# Patient Record
Sex: Male | Born: 1975 | Race: White | Hispanic: No | Marital: Married | Smoking: Never smoker
Health system: Southern US, Community
[De-identification: ages and names within clinical notes are randomized; demographics above are authoritative.]

## PROBLEM LIST (undated history)

## (undated) DIAGNOSIS — I10 Essential (primary) hypertension: Secondary | ICD-10-CM

## (undated) DIAGNOSIS — G919 Hydrocephalus, unspecified: Secondary | ICD-10-CM

## (undated) DIAGNOSIS — F1120 Opioid dependence, uncomplicated: Secondary | ICD-10-CM

## (undated) DIAGNOSIS — M109 Gout, unspecified: Secondary | ICD-10-CM

---

## 2004-06-21 ENCOUNTER — Emergency Department: Payer: Self-pay | Admitting: Emergency Medicine

## 2004-06-23 ENCOUNTER — Ambulatory Visit: Payer: Self-pay

## 2004-07-07 ENCOUNTER — Ambulatory Visit: Payer: Self-pay | Admitting: Unknown Physician Specialty

## 2004-09-30 ENCOUNTER — Emergency Department: Payer: Self-pay | Admitting: Emergency Medicine

## 2005-06-21 ENCOUNTER — Emergency Department: Payer: Self-pay | Admitting: Unknown Physician Specialty

## 2007-08-02 ENCOUNTER — Emergency Department: Payer: Self-pay | Admitting: Emergency Medicine

## 2007-08-02 ENCOUNTER — Other Ambulatory Visit: Payer: Self-pay

## 2009-08-17 ENCOUNTER — Emergency Department: Payer: Self-pay | Admitting: Emergency Medicine

## 2009-09-22 ENCOUNTER — Emergency Department: Payer: Self-pay | Admitting: Emergency Medicine

## 2009-11-30 ENCOUNTER — Emergency Department: Payer: Self-pay | Admitting: Unknown Physician Specialty

## 2010-01-06 ENCOUNTER — Emergency Department: Payer: Self-pay | Admitting: Internal Medicine

## 2010-01-11 ENCOUNTER — Emergency Department: Payer: Self-pay | Admitting: Emergency Medicine

## 2011-06-17 ENCOUNTER — Emergency Department: Payer: Self-pay | Admitting: Unknown Physician Specialty

## 2012-03-03 ENCOUNTER — Emergency Department: Payer: Self-pay | Admitting: Emergency Medicine

## 2012-03-03 LAB — COMPREHENSIVE METABOLIC PANEL
BUN: 16 mg/dL (ref 7–18)
Bilirubin,Total: 1 mg/dL (ref 0.2–1.0)
Calcium, Total: 9.2 mg/dL (ref 8.5–10.1)
Chloride: 105 mmol/L (ref 98–107)
Co2: 29 mmol/L (ref 21–32)
Creatinine: 1.11 mg/dL (ref 0.60–1.30)
EGFR (African American): >60
EGFR (Non-African Amer.): >60
SGOT(AST): 32 U/L (ref 15–37)
SGPT (ALT): 62 U/L (ref 12–78)
Total Protein: 8.3 g/dL — ABNORMAL HIGH (ref 6.4–8.2)

## 2012-03-03 LAB — CBC WITH DIFFERENTIAL/PLATELET
Basophil #: 0.1 10*3/uL (ref 0.0–0.1)
HCT: 41.2 % (ref 40.0–52.0)
Lymphocyte %: 19.1 %
MCHC: 34.9 g/dL (ref 32.0–36.0)
Monocyte %: 13.4 %
Neutrophil #: 4.6 10*3/uL (ref 1.4–6.5)
Neutrophil %: 63.6 %
Platelet: 162 10*3/uL (ref 150–440)
RDW: 13.1 % (ref 11.5–14.5)
WBC: 7.2 10*3/uL (ref 3.8–10.6)

## 2012-03-03 LAB — MONONUCLEOSIS SCREEN: Mono Test: NEGATIVE

## 2012-03-03 LAB — RAPID INFLUENZA A&B ANTIGENS

## 2012-03-05 LAB — BETA STREP CULTURE(ARMC)

## 2012-07-30 ENCOUNTER — Ambulatory Visit: Payer: Self-pay | Admitting: Nurse Practitioner

## 2012-11-10 ENCOUNTER — Emergency Department: Payer: Self-pay | Admitting: Emergency Medicine

## 2012-11-12 LAB — BETA STREP CULTURE(ARMC)

## 2012-11-18 ENCOUNTER — Emergency Department: Payer: Self-pay | Admitting: Emergency Medicine

## 2012-11-25 ENCOUNTER — Emergency Department: Payer: Self-pay | Admitting: Emergency Medicine

## 2013-05-27 ENCOUNTER — Emergency Department: Payer: Self-pay | Admitting: Emergency Medicine

## 2013-05-27 LAB — COMPREHENSIVE METABOLIC PANEL
Albumin: 3.9 g/dL (ref 3.4–5.0)
Alkaline Phosphatase: 49 U/L
Anion Gap: 5 — ABNORMAL LOW (ref 7–16)
BUN: 18 mg/dL (ref 7–18)
Bilirubin,Total: 0.6 mg/dL (ref 0.2–1.0)
Calcium, Total: 8.7 mg/dL (ref 8.5–10.1)
Chloride: 103 mmol/L (ref 98–107)
Co2: 28 mmol/L (ref 21–32)
Creatinine: 1.11 mg/dL (ref 0.60–1.30)
EGFR (African American): >60
EGFR (Non-African Amer.): >60
Glucose: 105 mg/dL — ABNORMAL HIGH (ref 65–99)
Osmolality: 274 (ref 275–301)
POTASSIUM: 3.8 mmol/L (ref 3.5–5.1)
SGOT(AST): 11 U/L — ABNORMAL LOW (ref 15–37)
SGPT (ALT): 34 U/L (ref 12–78)
Sodium: 136 mmol/L (ref 136–145)
TOTAL PROTEIN: 7.7 g/dL (ref 6.4–8.2)

## 2013-05-27 LAB — CK TOTAL AND CKMB (NOT AT ARMC)
CK, Total: 80 U/L (ref 35–232)
CK-MB: 1.2 ng/mL (ref 0.5–3.6)

## 2013-05-27 LAB — RAPID INFLUENZA A&B ANTIGENS

## 2013-05-28 LAB — CBC WITH DIFFERENTIAL/PLATELET
BASOS PCT: 0.7 %
Basophil #: 0.1 10*3/uL (ref 0.0–0.1)
EOS PCT: 1 %
Eosinophil #: 0.1 10*3/uL (ref 0.0–0.7)
HCT: 43.6 % (ref 40.0–52.0)
HGB: 15.3 g/dL (ref 13.0–18.0)
LYMPHS ABS: 1.9 10*3/uL (ref 1.0–3.6)
Lymphocyte %: 17.7 %
MCH: 29.7 pg (ref 26.0–34.0)
MCHC: 35.2 g/dL (ref 32.0–36.0)
MCV: 84 fL (ref 80–100)
MONOS PCT: 8.7 %
Monocyte #: 0.9 x10 3/mm (ref 0.2–1.0)
Neutrophil #: 7.8 10*3/uL — ABNORMAL HIGH (ref 1.4–6.5)
Neutrophil %: 71.9 %
Platelet: 213 10*3/uL (ref 150–440)
RBC: 5.17 10*6/uL (ref 4.40–5.90)
RDW: 12.8 % (ref 11.5–14.5)
WBC: 10.8 10*3/uL — AB (ref 3.8–10.6)

## 2013-05-30 ENCOUNTER — Emergency Department: Payer: Self-pay | Admitting: Emergency Medicine

## 2016-06-10 ENCOUNTER — Emergency Department
Admission: EM | Admit: 2016-06-10 | Discharge: 2016-06-10 | Disposition: A | Discharge status: Home / Self Care | Payer: Self-pay | Attending: Emergency Medicine | Admitting: Emergency Medicine

## 2016-06-10 ENCOUNTER — Encounter: Payer: Self-pay | Admitting: Emergency Medicine

## 2016-06-10 ENCOUNTER — Emergency Department: Payer: Self-pay

## 2016-06-10 DIAGNOSIS — R519 Headache, unspecified: Secondary | ICD-10-CM

## 2016-06-10 DIAGNOSIS — I10 Essential (primary) hypertension: Secondary | ICD-10-CM | POA: Insufficient documentation

## 2016-06-10 DIAGNOSIS — R51 Headache: Secondary | ICD-10-CM | POA: Insufficient documentation

## 2016-06-10 HISTORY — DX: Gout, unspecified: M10.9

## 2016-06-10 HISTORY — DX: Essential (primary) hypertension: I10

## 2016-06-10 HISTORY — DX: Hydrocephalus, unspecified: G91.9

## 2016-06-10 MED ORDER — SODIUM CHLORIDE 0.9 % IV SOLN
Freq: Once | INTRAVENOUS | Status: AC
  Administered 2016-06-10: 17:00:00 via INTRAVENOUS

## 2016-06-10 MED ORDER — KETOROLAC TROMETHAMINE 30 MG/ML IJ SOLN
30.0000 mg | Freq: Once | INTRAMUSCULAR | Status: AC
  Administered 2016-06-10: 30 mg via INTRAVENOUS
  Filled 2016-06-10: qty 1

## 2016-06-10 MED ORDER — BUTALBITAL-APAP-CAFFEINE 50-325-40 MG PO TABS: 1.0000 | ORAL_TABLET | Freq: Four times a day (QID) | ORAL | 0 refills | Status: AC | PRN

## 2016-06-10 MED ORDER — DIPHENHYDRAMINE HCL 50 MG/ML IJ SOLN
25.0000 mg | Freq: Once | INTRAMUSCULAR | Status: AC
  Administered 2016-06-10: 25 mg via INTRAVENOUS
  Filled 2016-06-10: qty 1

## 2016-06-10 MED ORDER — METOCLOPRAMIDE HCL 5 MG/ML IJ SOLN
10.0000 mg | Freq: Once | INTRAMUSCULAR | Status: AC
  Administered 2016-06-10: 10 mg via INTRAVENOUS
  Filled 2016-06-10: qty 2

## 2016-06-10 NOTE — ED Triage Notes (Signed)
C/O headache x 4 days.  Describes pain and aching pain.  Describes headache as a "tension headache".  Had similar headache about 6-8 years ago and was treated through ED with "a shot".

## 2016-06-10 NOTE — ED Provider Notes (Signed)
[facility] Emergency Department Provider Note        Time seen: ----------------------------------------- 4:17 PM on 06/10/2016 -----------------------------------------    I have reviewed the triage vital signs and the nursing notes.   HISTORY  Chief Complaint Headache    HPI Matthew Forbes is a 41 y.o. male who presents to the ER for headache for the last 4 days. Patient describes pain as aching. He states he has had a tension headache and a similar headache 6-8 years ago and was treated through the ER with a shot. Patient cannot rule out medication he was given.Patient states the headache was in the occipital region, describes it as an ache. Nothing made it better, getting up walking around seems to make it worse. 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   Past Medical History:  Diagnosis Date  . Gout   . Hydrocephalus    As a child  . Hypertension     There are no active problems to display for this patient.   History reviewed. No pertinent surgical history.  Allergies Patient has no known allergies.  Social History Social History  Substance Use Topics  . Smoking status: Never Smoker  . Smokeless tobacco: Never Used  . Alcohol use No    Review of Systems Constitutional: Negative for fever. Cardiovascular: Negative for chest pain. Respiratory: Negative for shortness of breath. Gastrointestinal: Negative for abdominal pain, vomiting and diarrhea. Genitourinary: Negative for dysuria. Musculoskeletal: Negative for back pain. Skin: Negative for rash. Neurological: Positive for headache  10-point ROS otherwise negative.  ____________________________________________   PHYSICAL EXAM:  VITAL SIGNS: ED Triage Vitals  Enc Vitals Group     BP 06/10/16 1511 (!) 161/121     Pulse Rate 06/10/16 1511 (!) 115     Resp 06/10/16 1511 16     Temp 06/10/16 1511 98.2 F  (36.8 C)     Temp Source 06/10/16 1511 Oral     SpO2 06/10/16 1511 98 %     Weight 06/10/16 1512 225 lb (102.1 kg)     Height 06/10/16 1512 5\' 9"  (1.753 m)     Head Circumference --      Peak Flow --      Pain Score 06/10/16 1512 8     Pain Loc --      Pain Edu? --      Excl. in GC? --     Constitutional: Alert and oriented. Well appearing and in no distress. Eyes: Conjunctivae are normal. PERRL. Normal extraocular movements. ENT   Head: Normocephalic and atraumatic.   Nose: No congestion/rhinnorhea.   Mouth/Throat: Mucous membranes are moist.   Neck: No stridor. Cardiovascular: Normal rate, regular rhythm. No murmurs, rubs, or gallops. Respiratory: Normal respiratory effort without tachypnea nor retractions. Breath sounds are clear and equal bilaterally. No wheezes/rales/rhonchi. Gastrointestinal: Soft and nontender. Normal bowel sounds Musculoskeletal: Nontender with normal range of motion in all extremities. No lower extremity tenderness nor edema. Neurologic:  Normal speech and language. No gross focal neurologic deficits are appreciated.  Skin:  Skin is warm, dry and intact. No rash noted. Psychiatric: Mood and affect are normal. Speech and behavior are normal.  ____________________________________________  ED COURSE:  Pertinent labs & imaging results that were available during my care of the patient were reviewed by me and considered in my medical decision making (see chart for details).   Patient presents the ER in  no distress with unusual headache. We will assess with CT imaging, give IV headache cocktail. 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  No acute intracranial abnormalities. ____________________________________________  FINAL ASSESSMENT AND PLAN  Headache  Plan: Patient with labs and imaging as dictated above. Patient is in no acute  distress, headache is currently improved with normal CT imaging. He is stable for outpatient follow-up with his doctor.   Matthew Forbes, Matthew Aguayo E, MD   Note: This note was generated in part or whole with voice recognition software. Voice recognition is usually quite accurate but there are transcription errors that can and very often do occur. I apologize for any typographical errors that were not detected and corrected.     Matthew FilbertJonathan Forbes Duvid Smalls, MD 06/10/16 [phone]

## 2016-06-11 ENCOUNTER — Emergency Department
Admission: EM | Admit: 2016-06-11 | Discharge: 2016-06-11 | Disposition: A | Discharge status: Home / Self Care | Payer: Self-pay | Attending: Emergency Medicine | Admitting: Emergency Medicine

## 2016-06-11 DIAGNOSIS — Z79899 Other long term (current) drug therapy: Secondary | ICD-10-CM | POA: Insufficient documentation

## 2016-06-11 DIAGNOSIS — I10 Essential (primary) hypertension: Secondary | ICD-10-CM | POA: Insufficient documentation

## 2016-06-11 DIAGNOSIS — R51 Headache: Secondary | ICD-10-CM | POA: Insufficient documentation

## 2016-06-11 DIAGNOSIS — R519 Headache, unspecified: Secondary | ICD-10-CM

## 2016-06-11 DIAGNOSIS — M62838 Other muscle spasm: Secondary | ICD-10-CM | POA: Insufficient documentation

## 2016-06-11 LAB — CBC WITH DIFFERENTIAL/PLATELET
BASOS ABS: 0 10*3/uL (ref 0–0.1)
Basophils Relative: 1 %
Eosinophils Absolute: 0.2 10*3/uL (ref 0–0.7)
Eosinophils Relative: 2 %
HEMATOCRIT: 43.2 % (ref 40.0–52.0)
HEMOGLOBIN: 15.4 g/dL (ref 13.0–18.0)
LYMPHS PCT: 20 %
Lymphs Abs: 1.9 10*3/uL (ref 1.0–3.6)
MCH: 29.9 pg (ref 26.0–34.0)
MCHC: 35.6 g/dL (ref 32.0–36.0)
MCV: 84.2 fL (ref 80.0–100.0)
MONO ABS: 0.8 10*3/uL (ref 0.2–1.0)
Monocytes Relative: 9 %
NEUTROS ABS: 6.7 10*3/uL — AB (ref 1.4–6.5)
Neutrophils Relative %: 70 %
Platelets: 217 10*3/uL (ref 150–440)
RBC: 5.13 MIL/uL (ref 4.40–5.90)
RDW: 13.2 % (ref 11.5–14.5)
WBC: 9.6 10*3/uL (ref 3.8–10.6)

## 2016-06-11 LAB — BASIC METABOLIC PANEL
ANION GAP: 6 (ref 5–15)
BUN: 18 mg/dL (ref 6–20)
CO2: 25 mmol/L (ref 22–32)
Calcium: 9.3 mg/dL (ref 8.9–10.3)
Chloride: 108 mmol/L (ref 101–111)
Creatinine, Ser: 1.25 mg/dL — ABNORMAL HIGH (ref 0.61–1.24)
GFR calc Af Amer: >60 mL/min (ref >60)
GFR calc non Af Amer: >60 mL/min (ref >60)
GLUCOSE: 103 mg/dL — AB (ref 65–99)
POTASSIUM: 3.7 mmol/L (ref 3.5–5.1)
Sodium: 139 mmol/L (ref 135–145)

## 2016-06-11 MED ORDER — PROCHLORPERAZINE EDISYLATE 5 MG/ML IJ SOLN
5.0000 mg | Freq: Once | INTRAMUSCULAR | Status: AC
  Administered 2016-06-11: 5 mg via INTRAVENOUS
  Filled 2016-06-11: qty 2

## 2016-06-11 MED ORDER — DIPHENHYDRAMINE HCL 50 MG/ML IJ SOLN
25.0000 mg | Freq: Once | INTRAMUSCULAR | Status: AC
  Administered 2016-06-11: 25 mg via INTRAVENOUS
  Filled 2016-06-11: qty 1

## 2016-06-11 MED ORDER — DEXTROSE-NACL 5-0.45 % IV SOLN
INTRAVENOUS | Status: DC
  Administered 2016-06-11: 1000 mL via INTRAVENOUS

## 2016-06-11 MED ORDER — KETOROLAC TROMETHAMINE 30 MG/ML IJ SOLN
30.0000 mg | Freq: Once | INTRAMUSCULAR | Status: AC
  Administered 2016-06-11: 30 mg via INTRAVENOUS
  Filled 2016-06-11: qty 1

## 2016-06-11 MED ORDER — LORAZEPAM 2 MG/ML IJ SOLN
1.0000 mg | Freq: Once | INTRAMUSCULAR | Status: AC
  Administered 2016-06-11: 1 mg via INTRAVENOUS
  Filled 2016-06-11: qty 1

## 2016-06-11 MED ORDER — PROCHLORPERAZINE MALEATE 10 MG PO TABS: 10.0000 mg | ORAL_TABLET | Freq: Four times a day (QID) | ORAL | 0 refills | Status: AC | PRN

## 2016-06-11 MED ORDER — MORPHINE SULFATE (PF) 4 MG/ML IV SOLN
4.0000 mg | Freq: Once | INTRAVENOUS | Status: AC
  Administered 2016-06-11: 4 mg via INTRAVENOUS

## 2016-06-11 MED ORDER — HYDROCODONE-ACETAMINOPHEN 5-325 MG PO TABS: 1.0000 | ORAL_TABLET | ORAL | 0 refills | Status: DC | PRN

## 2016-06-11 MED ORDER — MORPHINE SULFATE (PF) 4 MG/ML IV SOLN
INTRAVENOUS | Status: AC
  Filled 2016-06-11: qty 1

## 2016-06-11 NOTE — ED Provider Notes (Signed)
[facility] Emergency Department Provider Note   ____________________________________________   First MD Initiated Contact with Patient 06/11/16 [id number]     (approximate)  I have reviewed the triage vital signs and the nursing notes.   HISTORY  Chief Complaint Headache and Neck Pain    HPI Matthew Forbes is a 41 y.o. male who presents to the ED from home with a chief complaint of recurrent headache. 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 Reports frequent headaches which migrate and location. States this is the fifth night of this constant headache. He was seen in the ED yesterday for same; received headache cocktail of Toradol, Reglan and Benadryl with improvement of his headache down to 4/10. Patient reports he felt better for several hours, then headache intensified. Complains mainly of pain in his posteriorhead and vertex associated with neck pain and spasms. Symptoms associated with photophobia. Denies associated fever, chills, chest pain, shortness of breath, abdominal pain, nausea, vomiting, diarrhea. Denies recent travel or trauma. Nothing makes the headache worse. Reports feeling dizzy (vertigo) while ambulating to the treatment room with mild tingling sensation on his left side. No associated slurred speech, confusion, facial droop or extremity weakness.   Past Medical History:  Diagnosis Date  . Gout   . Hydrocephalus    As a child  . Hypertension     There are no active problems to display for this patient.   History reviewed. No pertinent surgical history.  Prior to Admission medications   Medication Sig Start Date End Date Taking? Authorizing Provider  butalbital-acetaminophen-caffeine (FIORICET, ESGIC) [phone] MG tablet Take 1-2 tablets by mouth every 6 (six) hours as needed for headache. 06/10/16 06/10/17  Emily FilbertJonathan E Williams, MD    Allergies Patient has no known  allergies.  No family history on file.  Social History Social History  Substance Use Topics  . Smoking status: Never Smoker  . Smokeless tobacco: Never Used  . Alcohol use No    Review of Systems  Constitutional: No fever/chills. Eyes: No visual changes. ENT: No sore throat. Cardiovascular: Denies chest pain. Respiratory: Denies shortness of breath. Gastrointestinal: No abdominal pain.  No nausea, no vomiting.  No diarrhea.  No constipation. Genitourinary: Negative for dysuria. Musculoskeletal: Negative for back pain. Skin: Negative for rash. Neurological: Positive for headaches. Negative for focal weakness or numbness.  10-point ROS otherwise negative.  ____________________________________________   PHYSICAL EXAM:  VITAL SIGNS: ED Triage Vitals  Enc Vitals Group     BP 06/11/16 0326 (!) 161/110     Pulse Rate 06/11/16 0326 (!) 101     Resp 06/11/16 0530 19     Temp 06/11/16 0326 97.9 F (36.6 C)     Temp Source 06/11/16 0326 Oral     SpO2 06/11/16 0326 100 %     Weight 06/11/16 0326 225 lb (102.1 kg)     Height 06/11/16 0326 5\' 9"  (1.753 m)     Head Circumference --      Peak Flow --      Pain Score 06/11/16 0331 9     Pain Loc --      Pain Edu? --      Excl. in GC? --     Constitutional: Alert and oriented. Well appearing and in mild acute distress. Eyes: Conjunctivae are normal. PERRL. EOMI. Head: Atraumatic. Nose: No congestion/rhinnorhea. Mouth/Throat: Mucous membranes are moist.  Oropharynx non-erythematous. Neck: No stridor.  Supple neck  without meningismus.  Bilateral paraspinal neck spasms.  No carotid bruits. Cardiovascular: Normal rate, regular rhythm. Grossly normal heart sounds.  Good peripheral circulation. Respiratory: Normal respiratory effort.  No retractions. Lungs CTAB. Gastrointestinal: Soft and nontender. No distention. No abdominal bruits. No CVA tenderness. Musculoskeletal: No lower extremity tenderness nor edema.  No joint  effusions. Neurologic:  Normal speech and language. No gross focal neurologic deficits are appreciated. No gait instability. Skin:  Skin is warm, dry and intact. No rash noted. No petechiae. Psychiatric: Mood and affect are normal. Speech and behavior are normal.  ____________________________________________   LABS (all labs ordered are listed, but only abnormal results are displayed)  Labs Reviewed  CBC WITH DIFFERENTIAL/PLATELET - Abnormal; Notable for the following:       Result Value   Neutro Abs 6.7 (*)    All other components within normal limits  BASIC METABOLIC PANEL - Abnormal; Notable for the following:    Glucose, Bld 103 (*)    Creatinine, Ser 1.25 (*)    All other components within normal limits   ____________________________________________  EKG  ED ECG REPORT I, SUNG,JADE J, the attending physician, personally viewed and interpreted this ECG.   Date: 06/11/2016  EKG Time: 0521  Rate: 92  Rhythm: normal EKG, normal sinus rhythm  Axis: Normal  Intervals:none  ST&T Change: T-wave inversion inferior leads. Compared to 07/2007, T wave inversion present in lead 3 ____________________________________________  RADIOLOGY  None ____________________________________________   PROCEDURES  Procedure(s) performed: None  Procedures  Critical Care performed: No  ____________________________________________   INITIAL IMPRESSION / ASSESSMENT AND PLAN / ED COURSE  Pertinent labs & imaging results that were available during my care of the patient were reviewed by me and considered in my medical decision making (see chart for details).  41 year old male with a history of headaches returns to the ED for acute posterior headache with neck spasms. Neck is supple without meningsmus. No focal neuorological deficits on exam. Will obtain screening labwork, administer IV compazine, benadryl, toradal and ativan for muscle relaxation. Reports history of hypertension but does  not currently take antihypertensives.  Clinical Course as of Jun 11 732  Sat Jun 11, 2016  4098 Updated patient of laboratory results. Resting more comfortably after IV medications. IV fluids infusing. Care transferred to Dr. Lenard Lance pending reassessment. If patient's pain continues to be improved and he is without focal neurological deficits, then I anticipate he would be able to be discharged home on Compazine.  [JS]    Clinical Course User Index [JS] Irean Hong, MD     ____________________________________________   FINAL CLINICAL IMPRESSION(S) / ED DIAGNOSES  Final diagnoses:  Acute nonintractable headache, unspecified headache type      NEW MEDICATIONS STARTED DURING THIS VISIT:  New Prescriptions   No medications on file     Note:  This document was prepared using Dragon voice recognition software and may include unintentional dictation errors.    Irean Hong, MD 06/11/16 [phone]

## 2016-06-11 NOTE — ED Notes (Signed)
Patient c/o dizziness and bodywide but more strongly left sided tingling as he is walking to ED01 and says he has blurred vision with imbalance perception.  He continues to complain of his headache pain.  BP is 171/117 during evaluation.

## 2016-06-11 NOTE — ED Triage Notes (Signed)
Pt presents to ED with c/o HA and neck pain. Pt reports being seen here yesterday for same, reports taking prescribed medications as directed without relief. Pt reports medications given in ED yesterday helped by bringing pain down to 4/10. Pt reports being unable to sleep d/t pain.

## 2016-06-11 NOTE — ED Provider Notes (Addendum)
-----------------------------------------   10:07 AM on 06/11/2016 -----------------------------------------  Patient states he feels much better. Complete resolution of headache. We'll discharge with a very short course of pain medication and PCP follow-up. Patient agreeable to plan. I provided my normal headache return precautions.   Minna AntisKevin Jakhi Dishman, MD 06/11/16 1007    Minna AntisKevin Jakayden Cancio, MD 06/11/16 [phone]

## 2016-06-11 NOTE — Discharge Instructions (Addendum)
1. You may take Compazine as needed for excess nausea. 2. Return to the ER for worsening symptoms, focal weakness or numbness, fever, persistent vomiting, difficulty breathing or other concerns.

## 2016-06-12 ENCOUNTER — Ambulatory Visit (HOSPITAL_COMMUNITY)
Admission: AD | Admit: 2016-06-12 | Discharge: 2016-06-12 | Disposition: A | Discharge status: Home / Self Care | Payer: Self-pay | Source: Other Acute Inpatient Hospital | Attending: Student in an Organized Health Care Education/Training Program | Admitting: Student in an Organized Health Care Education/Training Program

## 2016-06-12 ENCOUNTER — Encounter: Payer: Self-pay | Admitting: *Deleted

## 2016-06-12 ENCOUNTER — Emergency Department
Admission: EM | Admit: 2016-06-12 | Discharge: 2016-06-12 | Disposition: A | Discharge status: Other Acute Inpatient Hospital | Payer: Self-pay | Attending: Student in an Organized Health Care Education/Training Program | Admitting: Student in an Organized Health Care Education/Training Program

## 2016-06-12 ENCOUNTER — Emergency Department: Payer: Self-pay

## 2016-06-12 DIAGNOSIS — Z982 Presence of cerebrospinal fluid drainage device: Secondary | ICD-10-CM | POA: Insufficient documentation

## 2016-06-12 DIAGNOSIS — R519 Headache, unspecified: Secondary | ICD-10-CM

## 2016-06-12 DIAGNOSIS — R51 Headache: Secondary | ICD-10-CM | POA: Insufficient documentation

## 2016-06-12 DIAGNOSIS — Z79899 Other long term (current) drug therapy: Secondary | ICD-10-CM | POA: Insufficient documentation

## 2016-06-12 DIAGNOSIS — I10 Essential (primary) hypertension: Secondary | ICD-10-CM | POA: Insufficient documentation

## 2016-06-12 LAB — CBC WITH DIFFERENTIAL/PLATELET
BASOS PCT: 0 %
Basophils Absolute: 0 10*3/uL (ref 0–0.1)
EOS ABS: 0.1 10*3/uL (ref 0–0.7)
EOS PCT: 2 %
HCT: 38.7 % — ABNORMAL LOW (ref 40.0–52.0)
Hemoglobin: 14.1 g/dL (ref 13.0–18.0)
Lymphocytes Relative: 12 %
Lymphs Abs: 1 10*3/uL (ref 1.0–3.6)
MCH: 30.2 pg (ref 26.0–34.0)
MCHC: 36.5 g/dL — AB (ref 32.0–36.0)
MCV: 82.7 fL (ref 80.0–100.0)
MONOS PCT: 4 %
Monocytes Absolute: 0.4 10*3/uL (ref 0.2–1.0)
NEUTROS PCT: 82 %
Neutro Abs: 7 10*3/uL — ABNORMAL HIGH (ref 1.4–6.5)
PLATELETS: 188 10*3/uL (ref 150–440)
RBC: 4.68 MIL/uL (ref 4.40–5.90)
RDW: 12.8 % (ref 11.5–14.5)
WBC: 8.5 10*3/uL (ref 3.8–10.6)

## 2016-06-12 LAB — COMPREHENSIVE METABOLIC PANEL
ALBUMIN: 4.3 g/dL (ref 3.5–5.0)
ALK PHOS: 41 U/L (ref 38–126)
ALT: 24 U/L (ref 17–63)
ANION GAP: 8 (ref 5–15)
AST: 24 U/L (ref 15–41)
BUN: 14 mg/dL (ref 6–20)
CHLORIDE: 107 mmol/L (ref 101–111)
CO2: 23 mmol/L (ref 22–32)
Calcium: 8.8 mg/dL — ABNORMAL LOW (ref 8.9–10.3)
Creatinine, Ser: 1.01 mg/dL (ref 0.61–1.24)
GFR calc non Af Amer: >60 mL/min (ref >60)
GLUCOSE: 114 mg/dL — AB (ref 65–99)
POTASSIUM: 3.6 mmol/L (ref 3.5–5.1)
SODIUM: 138 mmol/L (ref 135–145)
Total Bilirubin: 1 mg/dL (ref 0.3–1.2)
Total Protein: 7.3 g/dL (ref 6.5–8.1)

## 2016-06-12 MED ORDER — PROCHLORPERAZINE EDISYLATE 5 MG/ML IJ SOLN
10.0000 mg | Freq: Once | INTRAMUSCULAR | Status: AC
  Administered 2016-06-12: 10 mg via INTRAVENOUS
  Filled 2016-06-12: qty 2

## 2016-06-12 MED ORDER — MAGNESIUM SULFATE 2 GM/50ML IV SOLN
2.0000 g | Freq: Once | INTRAVENOUS | Status: AC
  Administered 2016-06-12: 2 g via INTRAVENOUS
  Filled 2016-06-12: qty 50

## 2016-06-12 MED ORDER — DEXAMETHASONE SODIUM PHOSPHATE 10 MG/ML IJ SOLN
10.0000 mg | Freq: Once | INTRAMUSCULAR | Status: AC
  Administered 2016-06-12: 10 mg via INTRAVENOUS
  Filled 2016-06-12: qty 1

## 2016-06-12 MED ORDER — DIPHENHYDRAMINE HCL 50 MG/ML IJ SOLN
25.0000 mg | Freq: Once | INTRAMUSCULAR | Status: AC
  Administered 2016-06-12: 25 mg via INTRAVENOUS
  Filled 2016-06-12: qty 1

## 2016-06-12 NOTE — ED Provider Notes (Signed)
[facility] Emergency Department Provider Note    First MD Initiated Contact with Patient 06/12/16 [phone]     (approximate)  I have reviewed the triage vital signs and the nursing notes.   HISTORY  Chief Complaint Headache    HPI Matthew Forbes is a 41 y.o. 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 Patient had CT imaging on first event that showed no evidence of hydrocephalus. He's remained neuro intact. States he has some blurry vision. States that he'll be treated symptomatically and have brief improvement and then after discharge will have recurrence of headache. States is similar to previous has never had one this severe. States is been gradually progressing. Denies any fevers. Has some neck soreness but is able to fully range his neck. No rash. No nausea or vomiting. No recent trauma.   Past Medical History:  Diagnosis Date  . Gout   . Hydrocephalus    As a child  . Hypertension    History reviewed. No pertinent family history. History reviewed. No pertinent surgical history. There are no active problems to display for this patient.     Prior to Admission medications   Medication Sig Start Date End Date Taking? Authorizing Provider  butalbital-acetaminophen-caffeine (FIORICET, ESGIC) [phone] MG tablet Take 1-2 tablets by mouth every 6 (six) hours as needed for headache. 06/10/16 06/10/17  Emily Filbert, MD  HYDROcodone-acetaminophen (NORCO/VICODIN) 5-325 MG tablet Take 1 tablet by mouth every 4 (four) hours as needed. 06/11/16   Minna Antis, MD  prochlorperazine (COMPAZINE) 10 MG tablet Take 1 tablet (10 mg total) by mouth every 6 (six) hours as needed for nausea. 06/11/16   Irean Hong, MD    Allergies Patient has no known allergies.    Social  History Social History  Substance Use Topics  . Smoking status: Never Smoker  . Smokeless tobacco: Never Used  . Alcohol use No    Review of Systems Patient denies headaches, rhinorrhea, blurry vision, numbness, shortness of breath, chest pain, edema, cough, abdominal pain, nausea, vomiting, diarrhea, dysuria, fevers, rashes or hallucinations unless otherwise stated above in HPI. ____________________________________________   PHYSICAL EXAM:  VITAL SIGNS: Vitals:   06/12/16 0451  BP: (!) 181/102  Pulse: 98  Resp: 18  Temp: 98.1 F (36.7 C)    Constitutional: Alert and oriented. Well appearing and in no acute distress. Eyes: Conjunctivae are normal. PERRL. EOMI. Head: Atraumatic. Nose: No congestion/rhinnorhea. Mouth/Throat: Mucous membranes are moist.  Oropharynx non-erythematous. Neck: No stridor. Painless ROM. No cervical spine tenderness to palpation Hematological/Lymphatic/Immunilogical: No cervical lymphadenopathy. Cardiovascular: Normal rate, regular rhythm. Grossly normal heart sounds.  Good peripheral circulation. Respiratory: Normal respiratory effort.  No retractions. Lungs CTAB. Gastrointestinal: Soft and nontender. No distention. No abdominal bruits. No CVA tenderness. Musculoskeletal: No lower extremity tenderness nor edema.  No joint effusions. Neurologic:  CN- intact.  No facial droop, Normal FNF.  Normal heel to shin.  Sensation intact bilaterally. Normal speech and language. No gross focal neurologic deficits are appreciated. No gait instability. Skin:  Skin is warm, dry and intact. No rash noted. Psychiatric: Mood and affect are normal. Speech and behavior are normal.  ____________________________________________   LABS (all labs ordered are listed, but only abnormal results are displayed)  No results found for this or any previous visit (from the past  24  hour(s)). ____________________________________________  EKG____________________________________________  RADIOLOGY  I personally reviewed all radiographic images ordered to evaluate for the above acute complaints and reviewed radiology reports and findings.  These findings were personally discussed with the patient.  Please see medical record for radiology report.    ____________________________________________   PROCEDURES  Procedure(s) performed:  Procedures    Critical Care performed: no ____________________________________________   INITIAL IMPRESSION / ASSESSMENT AND PLAN / ED COURSE  Pertinent labs & imaging results that were available during my care of the patient were reviewed by me and considered in my medical decision making (see chart for details).  DDX: shunt failure, hydro, meningitis, tension, cluster  Matthew Forbes is a 41 y.o. who presents to the ED with persistent severe headache as described above. Patient afebrile with mild hypertension. This is now his third visit for similar headache. Has not been able to follow-up with primary care. States that outpatient medications are not improving. States that the symptoms and vision disturbances worsening. Denies any fevers. His neck is supple with full painless range of motion therefore in the setting of no fever and no tachycardia with 5 days of headache I do not feel this is consistent with meningitis. Shunt series ordered to evaluate for shunt fracture shows no acute abnormality. At this point I do feel that the patient would benefit ROM secondary evaluation and probable [facility].  Spoke with the ER physician [facility] who kindly agrees to evaluate patient.  Have discussed with the patient and available family all diagnostics and treatments performed thus far and all questions were answered to the best of my ability. The patient  demonstrates understanding and agreement with plan.       ____________________________________________   FINAL CLINICAL IMPRESSION(S) / ED DIAGNOSES  Final diagnoses:  Headache  Headache      NEW MEDICATIONS STARTED DURING THIS VISIT:  New Prescriptions   No medications on file     Note:  This document was prepared using Dragon voice recognition software and may include unintentional dictation errors.    Willy EddyPatrick Chloe Bluett, MD 06/12/16 [phone]

## 2016-06-12 NOTE — ED Notes (Signed)
Pt here for headache; third day in a row pt has been evaluated here for same; pt says he wants to be referred to a specialist or admitted; pt ambulatory with steady gait; in no acute distress

## 2016-06-12 NOTE — ED Triage Notes (Signed)
Pt is neurologically intact.

## 2016-06-12 NOTE — ED Notes (Signed)
Report given to [facility] 

## 2016-06-12 NOTE — ED Triage Notes (Signed)
Pt presents w/ c/o persistent headache that is not controlled by rx'd meds. Pt states hx of migraine and has had a shunt placed in head. Pt c/o headache has persisted for past 5 days despite multiple visits to ED for pain control. Pt states he is unable to f/u outpt due to lack of insurance and inability to pay. Pt denies vomiting, c/o nausea intermittently.

## 2016-06-15 ENCOUNTER — Emergency Department
Admission: EM | Admit: 2016-06-15 | Discharge: 2016-06-15 | Disposition: A | Discharge status: Home / Self Care | Payer: Self-pay | Attending: Emergency Medicine | Admitting: Emergency Medicine

## 2016-06-15 ENCOUNTER — Encounter: Payer: Self-pay | Admitting: Emergency Medicine

## 2016-06-15 DIAGNOSIS — J09X2 Influenza due to identified novel influenza A virus with other respiratory manifestations: Secondary | ICD-10-CM | POA: Insufficient documentation

## 2016-06-15 DIAGNOSIS — J101 Influenza due to other identified influenza virus with other respiratory manifestations: Secondary | ICD-10-CM

## 2016-06-15 DIAGNOSIS — I10 Essential (primary) hypertension: Secondary | ICD-10-CM | POA: Insufficient documentation

## 2016-06-15 LAB — POCT RAPID STREP A: STREPTOCOCCUS, GROUP A SCREEN (DIRECT): NEGATIVE

## 2016-06-15 LAB — INFLUENZA PANEL BY PCR (TYPE A & B)
INFLAPCR: POSITIVE — AB
Influenza B By PCR: NEGATIVE

## 2016-06-15 MED ORDER — OSELTAMIVIR PHOSPHATE 75 MG PO CAPS
75.0000 mg | ORAL_CAPSULE | Freq: Once | ORAL | Status: AC
  Administered 2016-06-15: 75 mg via ORAL
  Filled 2016-06-15: qty 1

## 2016-06-15 MED ORDER — OSELTAMIVIR PHOSPHATE 75 MG PO CAPS: 75.0000 mg | ORAL_CAPSULE | Freq: Two times a day (BID) | ORAL | 0 refills | Status: AC

## 2016-06-15 MED ORDER — ACETAMINOPHEN 500 MG PO TABS
1000.0000 mg | ORAL_TABLET | Freq: Once | ORAL | Status: AC
  Administered 2016-06-15: 1000 mg via ORAL
  Filled 2016-06-15: qty 2

## 2016-06-15 NOTE — ED Notes (Signed)
Pt given ginger ale.

## 2016-06-15 NOTE — ED Notes (Signed)
Upon assessment pt reports feeling weak, having a sore throat, and fever.

## 2016-06-15 NOTE — ED Provider Notes (Signed)
[facility] Emergency Department Provider Note   ____________________________________________   First MD Initiated Contact with Patient 06/15/16 [phone]     (approximate)  I have reviewed the triage vital signs and the nursing notes.   HISTORY  Chief Complaint Fever    HPI Matthew Forbes is a 41 y.o. male who comes into the hospital today with a concern that he has flu. The patient reports he started having a cough about 2-3 days ago. He reports that the cough worsened yesterday. Within a few hours he developed fever to 102. He has body aches in his chest hurts when he coughs. The patient reports that he thinks he got the fluid here because he was here with a headache a few days ago and there are many people in the waiting room with the flu. The patient rates his pain a 6 out of 10 in intensity currently. He reports that he just feels beat up and run over. His throat is raw from coughing. He reports he has some mild shortness of breath but nothing severe. He denies any nausea or vomiting. The patient's here today for evaluation.   Past Medical History:  Diagnosis Date  . Gout   . Hydrocephalus    As a child  . Hypertension     There are no active problems to display for this patient.   History reviewed. No pertinent surgical history.  Prior to Admission medications   Medication Sig Start Date End Date Taking? Authorizing Provider  butalbital-acetaminophen-caffeine (FIORICET, ESGIC) [phone] MG tablet Take 1-2 tablets by mouth every 6 (six) hours as needed for headache. 06/10/16 06/10/17  Emily FilbertJonathan E Williams, MD  HYDROcodone-acetaminophen (NORCO/VICODIN) 5-325 MG tablet Take 1 tablet by mouth every 4 (four) hours as needed. 06/11/16   Minna AntisKevin Paduchowski, MD  oseltamivir (TAMIFLU) 75 MG capsule Take 1 capsule (75 mg total) by mouth 2 (two) times daily. 06/15/16 06/20/16  Rebecka ApleyAllison P Karlina Suares, MD  prochlorperazine (COMPAZINE) 10 MG tablet Take 1 tablet (10 mg  total) by mouth every 6 (six) hours as needed for nausea. 06/11/16   Irean HongJade J Sung, MD    Allergies Patient has no known allergies.  No family history on file.  Social History Social History  Substance Use Topics  . Smoking status: Never Smoker  . Smokeless tobacco: Never Used  . Alcohol use No    Review of Systems Constitutional: No fever/chills Eyes: No visual changes. ENT:  sore throat. Cardiovascular:  chest pain Cough Respiratory: mild shortness of breath. Gastrointestinal: No abdominal pain.  No nausea, no vomiting.  No diarrhea.  No constipation. Genitourinary: Negative for dysuria. Musculoskeletal: body aches Skin: Negative for rash. Neurological: Negative for headaches, focal weakness or numbness.  10-point ROS otherwise negative.  ____________________________________________   PHYSICAL EXAM:  VITAL SIGNS: ED Triage Vitals  Enc Vitals Group     BP 06/15/16 0240 (!) 156/105     Pulse Rate 06/15/16 0240 (!) 119     Resp 06/15/16 0240 20     Temp 06/15/16 0240 99.5 F (37.5 C)     Temp src --      SpO2 06/15/16 0240 97 %     Weight 06/15/16 0241 225 lb (102.1 kg)     Height 06/15/16 0241 5\' 9"  (1.753 m)     Head Circumference --      Peak Flow --      Pain Score 06/15/16 0241 5     Pain Loc --  Pain Edu? --      Excl. in GC? --     Constitutional: Alert and oriented. Well appearing and in Mild distress. Eyes: Conjunctivae are normal. PERRL. EOMI. Head: Atraumatic. Nose: No congestion/rhinnorhea. Mouth/Throat: Mucous membranes are moist.  Oropharynx non-erythematous. Cardiovascular: Normal rate, regular rhythm. Grossly normal heart sounds.  Good peripheral circulation. Respiratory: Normal respiratory effort.  No retractions. Lungs CTAB. Gastrointestinal: Soft and nontender. No distention. Positive bowel sounds Musculoskeletal: No lower extremity tenderness nor edema.   Neurologic:  Normal speech and language.  Skin:  Skin is warm, dry and intact.   Psychiatric: Mood and affect are normal.   ____________________________________________   LABS (all labs ordered are listed, but only abnormal results are displayed)  Labs Reviewed  INFLUENZA PANEL BY PCR (TYPE A & B) - Abnormal; Notable for the following:       Result Value   Influenza A By PCR POSITIVE (*)    All other components within normal limits  CULTURE, GROUP A STREP [facility])  POCT RAPID STREP A   ____________________________________________  EKG  none ____________________________________________  RADIOLOGY  none ____________________________________________   PROCEDURES  Procedure(s) performed: None  Procedures  Critical Care performed: No  ____________________________________________   INITIAL IMPRESSION / ASSESSMENT AND PLAN / ED COURSE  Pertinent labs & imaging results that were available during my care of the patient were reviewed by me and considered in my medical decision making (see chart for details).  This is a 41 year old male who comes into the hospital today with flulike symptoms. The patient did have a flu test as well as a strep and his fluids positive. He is mildly tachycardic I will give him a dose of Tylenol 1000 mg, Tamiflu 75 mg and some oral hydration. The patient is not febrile at this time. I will reassess the patient and ensure that his heart rate is improved prior to his discharge.     Patient's heart rate is improved. He'll be discharged home. ____________________________________________   FINAL CLINICAL IMPRESSION(S) / ED DIAGNOSES  Final diagnoses:  Influenza A      NEW MEDICATIONS STARTED DURING THIS VISIT:  New Prescriptions   OSELTAMIVIR (TAMIFLU) 75 MG CAPSULE    Take 1 capsule (75 mg total) by mouth 2 (two) times daily.     Note:  This document was prepared using Dragon voice recognition software and may include unintentional dictation errors.    Rebecka Apley, MD 06/15/16 [phone]

## 2016-06-15 NOTE — ED Triage Notes (Signed)
Pt presents to ED with c/o flu-like symptoms, dry cough x 2 days, sore throat, chills, fever and generalized body aches since last night.  tmax fever 102 and pt took ibuprofen with relief. Denies n/v/d.

## 2016-06-17 LAB — CULTURE, GROUP A STREP (THRC)

## 2018-01-31 ENCOUNTER — Other Ambulatory Visit: Payer: Self-pay

## 2018-01-31 ENCOUNTER — Encounter: Payer: Self-pay | Admitting: *Deleted

## 2018-01-31 ENCOUNTER — Emergency Department: Payer: Self-pay

## 2018-01-31 ENCOUNTER — Emergency Department
Admission: EM | Admit: 2018-01-31 | Discharge: 2018-01-31 | Disposition: A | Discharge status: Home / Self Care | Payer: Self-pay | Attending: Emergency Medicine | Admitting: Emergency Medicine

## 2018-01-31 DIAGNOSIS — I10 Essential (primary) hypertension: Secondary | ICD-10-CM | POA: Insufficient documentation

## 2018-01-31 DIAGNOSIS — R202 Paresthesia of skin: Secondary | ICD-10-CM | POA: Insufficient documentation

## 2018-01-31 DIAGNOSIS — M25512 Pain in left shoulder: Secondary | ICD-10-CM | POA: Insufficient documentation

## 2018-01-31 DIAGNOSIS — M5410 Radiculopathy, site unspecified: Secondary | ICD-10-CM | POA: Insufficient documentation

## 2018-01-31 LAB — CBC
HCT: 47 % (ref 40.0–52.0)
HEMOGLOBIN: 16.6 g/dL (ref 13.0–18.0)
MCH: 29.9 pg (ref 26.0–34.0)
MCHC: 35.4 g/dL (ref 32.0–36.0)
MCV: 84.4 fL (ref 80.0–100.0)
PLATELETS: 246 10*3/uL (ref 150–440)
RBC: 5.57 MIL/uL (ref 4.40–5.90)
RDW: 13.1 % (ref 11.5–14.5)
WBC: 9.1 10*3/uL (ref 3.8–10.6)

## 2018-01-31 LAB — BASIC METABOLIC PANEL
ANION GAP: 10 (ref 5–15)
BUN: 17 mg/dL (ref 6–20)
CALCIUM: 9.8 mg/dL (ref 8.9–10.3)
CO2: 26 mmol/L (ref 22–32)
CREATININE: 1.04 mg/dL (ref 0.61–1.24)
Chloride: 105 mmol/L (ref 98–111)
Glucose, Bld: 123 mg/dL — ABNORMAL HIGH (ref 70–99)
Potassium: 4.9 mmol/L (ref 3.5–5.1)
SODIUM: 141 mmol/L (ref 135–145)

## 2018-01-31 LAB — TROPONIN I

## 2018-01-31 MED ORDER — HYDROCODONE-ACETAMINOPHEN 5-325 MG PO TABS
1.0000 | ORAL_TABLET | Freq: Once | ORAL | Status: AC
  Administered 2018-01-31: 1 via ORAL
  Filled 2018-01-31: qty 1

## 2018-01-31 MED ORDER — PREDNISONE 20 MG PO TABS: 40.0000 mg | ORAL_TABLET | Freq: Every day | ORAL | 0 refills | Status: DC

## 2018-01-31 MED ORDER — PREDNISONE 20 MG PO TABS
60.0000 mg | ORAL_TABLET | Freq: Once | ORAL | Status: AC
Start: 2018-01-31 — End: 2018-01-31
  Administered 2018-01-31: 60 mg via ORAL
  Filled 2018-01-31: qty 3

## 2018-01-31 MED ORDER — HYDROCODONE-ACETAMINOPHEN 5-325 MG PO TABS: 1.0000 | ORAL_TABLET | ORAL | 0 refills | Status: DC | PRN

## 2018-01-31 NOTE — ED Triage Notes (Addendum)
Pt to ED reporting intermittent left shoulder and left neck pains with numbness in left arm. No dizziness or lightheadedness reported but pt reports a headache when the pain worsens. No cardiac hx but hx of HTN reported. No injury reported

## 2018-01-31 NOTE — ED Provider Notes (Signed)
[facility] Emergency Department Provider Note  Time seen: 2:17 PM  I have reviewed the triage vital signs and the nursing notes.   HISTORY  Chief Complaint Neck Pain and Shoulder Pain    HPI Matthew Forbes is a 42 y.o. male with no significant past medical history besides hypertension who presents to the emergency department for left neck and left shoulder pain.  According to the patient for the past few months he has been expensing pain in the left neck especially with movement of the neck.  He states over the past few weeks the pain is gone into the shoulder and occasionally will have tingling sensations in his left shoulder with certain neck movements.  Denies any chest pain.  Denies any trouble breathing.  No nausea or diaphoresis.  Describes the pain is moderate dull pain worse with movement of the neck.   Past Medical History:  Diagnosis Date  . Gout   . Hydrocephalus    As a child  . Hypertension     There are no active problems to display for this patient.   History reviewed. No pertinent surgical history.  Prior to Admission medications   Medication Sig Start Date End Date Taking? Authorizing Provider  HYDROcodone-acetaminophen (NORCO/VICODIN) 5-325 MG tablet Take 1 tablet by mouth every 4 (four) hours as needed. 06/11/16   Minna Antis, MD  prochlorperazine (COMPAZINE) 10 MG tablet Take 1 tablet (10 mg total) by mouth every 6 (six) hours as needed for nausea. 06/11/16   Irean Hong, MD    No Known Allergies  History reviewed. No pertinent family history.  Social History Social History   Tobacco Use  . Smoking status: Never Smoker  . Smokeless tobacco: Never Used  Substance Use Topics  . Alcohol use: No  . Drug use: No    Review of Systems Constitutional: Negative for fever. Cardiovascular: Negative for chest pain. Respiratory: Negative for shortness of breath. Gastrointestinal: Negative for abdominal pain Genitourinary:  Negative for urinary compaints Musculoskeletal: Left neck/left shoulder pain. Skin: Negative for skin complaints  Neurological: Negative for headache.  Occasional tingling in the left shoulder with certain movements of the neck. All other ROS negative  ____________________________________________   PHYSICAL EXAM:  VITAL SIGNS: ED Triage Vitals  Enc Vitals Group     BP 01/31/18 1242 (!) 172/118     Pulse Rate 01/31/18 1242 (!) 123     Resp 01/31/18 1242 16     Temp 01/31/18 1242 98.8 F (37.1 C)     Temp Source 01/31/18 1242 Oral     SpO2 01/31/18 1242 95 %     Weight 01/31/18 1243 245 lb (111.1 kg)     Height 01/31/18 1243 5\' 9"  (1.753 m)     Head Circumference --      Peak Flow --      Pain Score 01/31/18 1242 8     Pain Loc --      Pain Edu? --      Excl. in GC? --     Constitutional: Alert and oriented. Well appearing and in no distress. Eyes: Normal exam ENT   Head: Normocephalic and atraumatic.   Mouth/Throat: Mucous membranes are moist. Cardiovascular: Normal rate, regular rhythm. No murmur Respiratory: Normal respiratory effort without tachypnea nor retractions. Breath sounds are clear  Gastrointestinal: Soft and nontender. No distention.   Musculoskeletal: Mild left paraspinal tenderness to palpation around the cervical spine with tenderness down the left trapezius.  Neurovascular intact  distally. Neurologic:  Normal speech and language. No gross focal neurologic deficits.  Equal grip strength.  No pronator drift. Skin:  Skin is warm, dry and intact.  Psychiatric: Mood and affect are normal. Speech and behavior are normal.   ____________________________________________    EKG  EKG reviewed and interpreted by myself shows sinus tachycardia 110 bpm with a narrow QRS, normal axis, normal intervals, no concerning ST changes.  ____________________________________________    RADIOLOGY  Chest x-ray  negative  ____________________________________________   INITIAL IMPRESSION / ASSESSMENT AND PLAN / ED COURSE  Pertinent labs & imaging results that were available during my care of the patient were reviewed by me and considered in my medical decision making (see chart for details).  Patient presents to the emergency department for left neck/left shoulder pain worse with movement.  Tenderness/pain is reproducible with palpation.  Pain is worse with movement.  Highly suspect muscular skeletal pain versus pinched nerve or radicular pain.  However differential would also include ACS.  Patient's labs including cardiac enzymes are negative.  Chest x-ray is normal.  We will treat the patient with a course of prednisone and pain medication.  I did discuss with the patient have his symptoms do not improve over the next 7 days he is to follow-up with orthopedics for further evaluation and consideration of possible localized corticosteroid injections.  ____________________________________________   FINAL CLINICAL IMPRESSION(S) / ED DIAGNOSES  Radicular pain Neck pain    Minna Antis, MD 01/31/18 1420

## 2020-06-05 ENCOUNTER — Encounter: Payer: Self-pay | Admitting: Emergency Medicine

## 2020-06-05 ENCOUNTER — Emergency Department: Payer: HRSA Program

## 2020-06-05 ENCOUNTER — Other Ambulatory Visit: Payer: Self-pay

## 2020-06-05 ENCOUNTER — Emergency Department
Admission: EM | Admit: 2020-06-05 | Discharge: 2020-06-05 | Disposition: A | Discharge status: Home / Self Care | Payer: HRSA Program | Attending: Emergency Medicine | Admitting: Emergency Medicine

## 2020-06-05 DIAGNOSIS — I10 Essential (primary) hypertension: Secondary | ICD-10-CM | POA: Diagnosis not present

## 2020-06-05 DIAGNOSIS — U071 COVID-19: Secondary | ICD-10-CM | POA: Diagnosis not present

## 2020-06-05 DIAGNOSIS — R059 Cough, unspecified: Secondary | ICD-10-CM | POA: Diagnosis present

## 2020-06-05 DIAGNOSIS — J1282 Pneumonia due to coronavirus disease 2019: Secondary | ICD-10-CM | POA: Diagnosis not present

## 2020-06-05 LAB — COMPREHENSIVE METABOLIC PANEL
ALT: 42 U/L (ref 0–44)
AST: 33 U/L (ref 15–41)
Albumin: 3.9 g/dL (ref 3.5–5.0)
Alkaline Phosphatase: 48 U/L (ref 38–126)
Anion gap: 15 (ref 5–15)
BUN: 19 mg/dL (ref 6–20)
CO2: 23 mmol/L (ref 22–32)
Calcium: 8.9 mg/dL (ref 8.9–10.3)
Chloride: 102 mmol/L (ref 98–111)
Creatinine, Ser: 1.03 mg/dL (ref 0.61–1.24)
GFR, Estimated: >60 mL/min (ref >60)
Glucose, Bld: 127 mg/dL — ABNORMAL HIGH (ref 70–99)
Potassium: 3.6 mmol/L (ref 3.5–5.1)
Sodium: 140 mmol/L (ref 135–145)
Total Bilirubin: 0.9 mg/dL (ref 0.3–1.2)
Total Protein: 7.8 g/dL (ref 6.5–8.1)

## 2020-06-05 LAB — CBC WITH DIFFERENTIAL/PLATELET
Abs Immature Granulocytes: 0.03 10*3/uL (ref 0.00–0.07)
Basophils Absolute: 0 10*3/uL (ref 0.0–0.1)
Basophils Relative: 0 %
Eosinophils Absolute: 0 10*3/uL (ref 0.0–0.5)
Eosinophils Relative: 0 %
HCT: 45.7 % (ref 39.0–52.0)
Hemoglobin: 15.9 g/dL (ref 13.0–17.0)
Immature Granulocytes: 1 %
Lymphocytes Relative: 30 %
Lymphs Abs: 1.4 10*3/uL (ref 0.7–4.0)
MCH: 29.4 pg (ref 26.0–34.0)
MCHC: 34.8 g/dL (ref 30.0–36.0)
MCV: 84.6 fL (ref 80.0–100.0)
Monocytes Absolute: 0.8 10*3/uL (ref 0.1–1.0)
Monocytes Relative: 17 %
Neutro Abs: 2.5 10*3/uL (ref 1.7–7.7)
Neutrophils Relative %: 52 %
Platelets: 132 10*3/uL — ABNORMAL LOW (ref 150–400)
RBC: 5.4 MIL/uL (ref 4.22–5.81)
RDW: 12.9 % (ref 11.5–15.5)
Smear Review: NORMAL
WBC: 4.8 10*3/uL (ref 4.0–10.5)
nRBC: 0 % (ref 0.0–0.2)

## 2020-06-05 LAB — TROPONIN I (HIGH SENSITIVITY): Troponin I (High Sensitivity): 5 ng/L (ref <18)

## 2020-06-05 LAB — RESP PANEL BY RT-PCR (FLU A&B, COVID) ARPGX2
Influenza A by PCR: NEGATIVE
Influenza B by PCR: NEGATIVE
SARS Coronavirus 2 by RT PCR: POSITIVE — AB

## 2020-06-05 LAB — LACTIC ACID, PLASMA: Lactic Acid, Venous: 1.8 mmol/L (ref 0.5–1.9)

## 2020-06-05 LAB — FIBRIN DERIVATIVES D-DIMER (ARMC ONLY): Fibrin derivatives D-dimer (ARMC): 598.62 ng/mL (FEU) — ABNORMAL HIGH (ref 0.00–499.00)

## 2020-06-05 MED ORDER — AZITHROMYCIN 250 MG PO TABS: ORAL_TABLET | ORAL | 0 refills | Status: DC

## 2020-06-05 MED ORDER — DEXAMETHASONE SODIUM PHOSPHATE 10 MG/ML IJ SOLN
10.0000 mg | Freq: Once | INTRAMUSCULAR | Status: AC
  Administered 2020-06-05: 10 mg via INTRAVENOUS
  Filled 2020-06-05: qty 1

## 2020-06-05 MED ORDER — IOHEXOL 350 MG/ML SOLN
75.0000 mL | Freq: Once | INTRAVENOUS | Status: AC | PRN
  Administered 2020-06-05: 75 mL via INTRAVENOUS
  Filled 2020-06-05: qty 75

## 2020-06-05 MED ORDER — METHYLPREDNISOLONE 4 MG PO TBPK: ORAL_TABLET | ORAL | 0 refills | Status: DC

## 2020-06-05 MED ORDER — ALBUTEROL SULFATE HFA 108 (90 BASE) MCG/ACT IN AERS: 2.0000 | INHALATION_SPRAY | Freq: Four times a day (QID) | RESPIRATORY_TRACT | 2 refills | Status: DC | PRN

## 2020-06-05 NOTE — Discharge Instructions (Signed)
Take over-the-counter vitamin C, vitamin D, and zinc to help boost your immune system Take over-the-counter Mucinex for cough as needed Take over-the-counter Tylenol and ibuprofen for fever and body aches Follow-up with your regular doctor as needed Return emergency department worsening Quarantine for 10 days from onset of symptoms.  At that time you may return to school/work if your symptoms have improved and you have been fever free for 24 hours.

## 2020-06-05 NOTE — ED Provider Notes (Signed)
[facility] Emergency Department Provider Note  ____________________________________________   Event Date/Time   First MD Initiated Contact with Patient 06/05/20 1502     (approximate)  I have reviewed the triage vital signs and the nursing notes.   HISTORY  Chief Complaint Fever and loss of smell    HPI Matthew Forbes is a 45 y.o. male presents to the emergency department with URI symptoms for 10 days.   Is complaining of cough, congestion, fever, chills, chest pain, shortness of breath, patient had a positive home COVID test.   patient is not vaccinated.  States he is coughing up blood at this time.  States he is very short of breath when he tries to walk.  Has a history of hypertension but does not take medication for it   Past Medical History:  Diagnosis Date  . Gout   . Hydrocephalus (HCC)    As a child  . Hypertension     There are no problems to display for this patient.   History reviewed. No pertinent surgical history.  Prior to Admission medications   Medication Sig Start Date End Date Taking? Authorizing Provider  albuterol (VENTOLIN HFA) 108 (90 Base) MCG/ACT inhaler Inhale 2 puffs into the lungs every 6 (six) hours as needed for wheezing or shortness of breath. 06/05/20  Yes Juliene Kirsh, Roselyn Bering, PA-C  azithromycin (ZITHROMAX Z-PAK) 250 MG tablet 2 pills today then 1 pill a day for 4 days 06/05/20  Yes Jami Ohlin, Roselyn Bering, PA-C  methylPREDNISolone (MEDROL DOSEPAK) 4 MG TBPK tablet Take 6 pills on day one then decrease by 1 pill each day 06/05/20  Yes Erinn Mendosa, Roselyn Bering, PA-C  HYDROcodone-acetaminophen (NORCO/VICODIN) 5-325 MG tablet Take 1 tablet by mouth every 4 (four) hours as needed. 01/31/18   Minna Antis, MD  predniSONE (DELTASONE) 20 MG tablet Take 2 tablets (40 mg total) by mouth daily. 01/31/18   Minna Antis, MD  prochlorperazine (COMPAZINE) 10 MG tablet Take 1 tablet (10 mg total) by mouth every 6 (six) hours as needed for  nausea. 06/11/16   Irean Hong, MD    Allergies Patient has no known allergies.  No family history on file.  Social History Social History   Tobacco Use  . Smoking status: Never Smoker  . Smokeless tobacco: Never Used  Substance Use Topics  . Alcohol use: No  . Drug use: No    Review of Systems  Constitutional: Positive fever/chills Eyes: No visual changes. ENT: Denies sore throat. Respiratory: Positive cough Cardiovascular: Complains of mild chest pain Gastrointestinal: Denies vomiting or diarrhea Genitourinary: Negative for dysuria. Musculoskeletal: Negative for back pain. Skin: Negative for rash. Neuro: Denies change in neuro status    ____________________________________________   PHYSICAL EXAM:  VITAL SIGNS: ED Triage Vitals  Enc Vitals Group     BP 06/05/20 1358 (!) 151/92     Pulse Rate 06/05/20 1358 96     Resp 06/05/20 1358 20     Temp 06/05/20 1358 98.1 F (36.7 C)     Temp Source 06/05/20 1358 Oral     SpO2 06/05/20 1401 97 %     Weight 06/05/20 1357 270 lb (122.5 kg)     Height 06/05/20 1357 5\' 9"  (1.753 m)     Head Circumference --      Peak Flow --      Pain Score 06/05/20 1357 5     Pain Loc --      Pain Edu? --  Excl. in GC? --     Constitutional: Alert and oriented. Well appearing and in no acute distress. Eyes: Conjunctivae are normal.  Head: Atraumatic. Nose: No congestion/rhinnorhea. Mouth/Throat: Mucous membranes are moist.  Neck:  supple no lymphadenopathy noted Cardiovascular: Normal rate, regular rhythm. Heart sounds are normal Respiratory: Normal respiratory effort.  No retractions, lungs CTA GU: deferred Musculoskeletal: FROM all extremities, warm and well perfused Neurologic:  Normal speech and language.  Skin:  Skin is warm, dry and intact. No rash noted. Psychiatric: Mood and affect are normal. Speech and behavior are normal.  ____________________________________________   LABS (all labs ordered are listed,  but only abnormal results are displayed)  Labs Reviewed  RESP PANEL BY RT-PCR (FLU A&B, COVID) ARPGX2 - Abnormal; Notable for the following components:      Result Value   SARS Coronavirus 2 by RT PCR POSITIVE (*)    All other components within normal limits  COMPREHENSIVE METABOLIC PANEL - Abnormal; Notable for the following components:   Glucose, Bld 127 (*)    All other components within normal limits  FIBRIN DERIVATIVES D-DIMER ([facility] ONLY) - Abnormal; Notable for the following components:   Fibrin derivatives D-dimer ([facility]) 598.62 (*)    All other components within normal limits  CBC WITH DIFFERENTIAL/PLATELET - Abnormal; Notable for the following components:   Platelets 132 (*)    All other components within normal limits  LACTIC ACID, PLASMA  LACTIC ACID, PLASMA  CBC WITH DIFFERENTIAL/PLATELET  TROPONIN I (HIGH SENSITIVITY)  TROPONIN I (HIGH SENSITIVITY)   ____________________________________________   ____________________________________________  RADIOLOGY  Chest x-ray  ____________________________________________   PROCEDURES  Procedure(s) performed: Ekg shows nsr, see physician read  Procedures    ____________________________________________   INITIAL IMPRESSION / ASSESSMENT AND PLAN / ED COURSE  Pertinent labs & imaging results that were available during my care of the patient were reviewed by me and considered in my medical decision making (see chart for details).   Patient is a 45 year old unvaccinated male who complains of covid symptoms.  Patient is complaining of shortness of breath along with coughing up blood.  Symptoms for more than 10 days.  Positive home test for COVID.  See HPI Physical exam shows patient to become short of breath when talking.  Vitals appear to be normal and stable  DDx: COVID, COVID-pneumonia, myocarditis, PE, MI  Chest x-ray, CBC, metabolic panel, troponin, lactic acid, D-dimer, EKG, and COVID  test  ----------------------------------------- 6:24 PM on 06/05/2020 -----------------------------------------  cbc is normal, metabolic panel is normal, resp panel is +covid, lactic acid is 1.8, troponin is normal, ddimer is elevated   Chest xray is normal Ct chest for PE is neg for PE but does show covid pneumonia  Did discuss the findings with patient.  Patient's oxygen saturation has remained above 95% on room air.  I do feel he is stable to be discharged.  He was given Decadron 10 mg IV.  He will be given a prescription for steroid pack, Zithromax, albuterol inhaler.  He is to return to the emergency department if worsening.  He was discharged in stable condition.  The patient was instructed to quarantine themselves at home.  Follow-up with your regular doctor if any concerns.  Return emergency department for worsening. OTC measures discussed     Matthew Forbes was evaluated in Emergency Department on 06/05/2020 for the symptoms described in the history of present illness. He was evaluated in the context of the global COVID-19 pandemic, which necessitated consideration that  the patient might be at risk for infection with the SARS-CoV-2 virus that causes COVID-19. Institutional protocols and algorithms that pertain to the evaluation of patients at risk for COVID-19 are in a state of rapid change based on information released by regulatory bodies including the CDC and federal and state organizations. These policies and algorithms were followed during the patient's care in the ED.   As part of my medical decision making, I reviewed the following data within the electronic MEDICAL RECORD NUMBER Nursing notes reviewed and incorporated, Labs reviewed , EKG interpreted NSR, Old chart reviewed, Radiograph reviewed , Notes from prior ED visits and [place] Controlled Substance Database  ____________________________________________   FINAL CLINICAL IMPRESSION(S) / ED DIAGNOSES  Final diagnoses:   Pneumonia due to COVID-19 virus      NEW MEDICATIONS STARTED DURING THIS VISIT:  New Prescriptions   ALBUTEROL (VENTOLIN HFA) 108 (90 BASE) MCG/ACT INHALER    Inhale 2 puffs into the lungs every 6 (six) hours as needed for wheezing or shortness of breath.   AZITHROMYCIN (ZITHROMAX Z-PAK) 250 MG TABLET    2 pills today then 1 pill a day for 4 days   METHYLPREDNISOLONE (MEDROL DOSEPAK) 4 MG TBPK TABLET    Take 6 pills on day one then decrease by 1 pill each day     Note:  This document was prepared using Dragon voice recognition software and may include unintentional dictation errors.    Faythe Ghee, PA-C 06/05/20 1827    Gilles Chiquito, MD 06/05/20 2036

## 2020-06-05 NOTE — ED Notes (Signed)
Attempted for 20g IV at L ac.  

## 2020-06-05 NOTE — ED Triage Notes (Signed)
Pt in via EMS from home with c/o covid +. Pt has fever.

## 2020-06-05 NOTE — ED Triage Notes (Signed)
Pt reports covid + for 10 days and still with intermittent fever and loss of smell and bodyaches and cough

## 2020-06-05 NOTE — ED Notes (Signed)
Lavender top sent to lab by this tech.

## 2022-02-23 ENCOUNTER — Emergency Department: Payer: Self-pay

## 2022-02-23 ENCOUNTER — Encounter: Payer: Self-pay | Admitting: Emergency Medicine

## 2022-02-23 ENCOUNTER — Other Ambulatory Visit: Payer: Self-pay

## 2022-02-23 ENCOUNTER — Emergency Department
Admission: EM | Admit: 2022-02-23 | Discharge: 2022-02-23 | Disposition: A | Discharge status: Home / Self Care | Payer: Self-pay | Attending: Emergency Medicine | Admitting: Emergency Medicine

## 2022-02-23 DIAGNOSIS — U071 COVID-19: Secondary | ICD-10-CM | POA: Insufficient documentation

## 2022-02-23 DIAGNOSIS — I10 Essential (primary) hypertension: Secondary | ICD-10-CM | POA: Insufficient documentation

## 2022-02-23 LAB — BASIC METABOLIC PANEL
Anion gap: 5 (ref 5–15)
BUN: 15 mg/dL (ref 6–20)
CO2: 29 mmol/L (ref 22–32)
Calcium: 8.8 mg/dL — ABNORMAL LOW (ref 8.9–10.3)
Chloride: 105 mmol/L (ref 98–111)
Creatinine, Ser: 1.26 mg/dL — ABNORMAL HIGH (ref 0.61–1.24)
GFR, Estimated: >60 mL/min (ref >60)
Glucose, Bld: 135 mg/dL — ABNORMAL HIGH (ref 70–99)
Potassium: 3.9 mmol/L (ref 3.5–5.1)
Sodium: 139 mmol/L (ref 135–145)

## 2022-02-23 LAB — RESP PANEL BY RT-PCR (FLU A&B, COVID) ARPGX2
Influenza A by PCR: NEGATIVE
Influenza B by PCR: NEGATIVE
SARS Coronavirus 2 by RT PCR: POSITIVE — AB

## 2022-02-23 LAB — CBC
HCT: 41.1 % (ref 39.0–52.0)
Hemoglobin: 13.6 g/dL (ref 13.0–17.0)
MCH: 29.2 pg (ref 26.0–34.0)
MCHC: 33.1 g/dL (ref 30.0–36.0)
MCV: 88.4 fL (ref 80.0–100.0)
Platelets: 174 10*3/uL (ref 150–400)
RBC: 4.65 MIL/uL (ref 4.22–5.81)
RDW: 13.2 % (ref 11.5–15.5)
WBC: 5.5 10*3/uL (ref 4.0–10.5)
nRBC: 0 % (ref 0.0–0.2)

## 2022-02-23 LAB — TROPONIN I (HIGH SENSITIVITY): Troponin I (High Sensitivity): 5 ng/L (ref <18)

## 2022-02-23 MED ORDER — ACETAMINOPHEN 500 MG PO TABS
1000.0000 mg | ORAL_TABLET | Freq: Once | ORAL | Status: AC
  Administered 2022-02-23: 1000 mg via ORAL
  Filled 2022-02-23: qty 2

## 2022-02-23 MED ORDER — NIRMATRELVIR/RITONAVIR (PAXLOVID)TABLET: 3.0000 | ORAL_TABLET | Freq: Two times a day (BID) | ORAL | 0 refills | Status: AC

## 2022-02-23 MED ORDER — PREDNISONE 50 MG PO TABS: 50.0000 mg | ORAL_TABLET | Freq: Every day | ORAL | 0 refills | Status: AC

## 2022-02-23 MED ORDER — ALBUTEROL SULFATE HFA 108 (90 BASE) MCG/ACT IN AERS: 2.0000 | INHALATION_SPRAY | Freq: Four times a day (QID) | RESPIRATORY_TRACT | 2 refills | Status: AC | PRN

## 2022-02-23 MED ORDER — IPRATROPIUM-ALBUTEROL 0.5-2.5 (3) MG/3ML IN SOLN
3.0000 mL | Freq: Once | RESPIRATORY_TRACT | Status: AC
  Administered 2022-02-23: 3 mL via RESPIRATORY_TRACT
  Filled 2022-02-23: qty 3

## 2022-02-23 MED ORDER — PREDNISONE 20 MG PO TABS
60.0000 mg | ORAL_TABLET | Freq: Once | ORAL | Status: AC
  Administered 2022-02-23: 60 mg via ORAL
  Filled 2022-02-23: qty 3

## 2022-02-23 MED ORDER — SODIUM CHLORIDE 0.9 % IV BOLUS
1000.0000 mL | Freq: Once | INTRAVENOUS | Status: AC
  Administered 2022-02-23: 1000 mL via INTRAVENOUS

## 2022-02-23 NOTE — ED Provider Notes (Signed)
[facility] Provider Note    Event Date/Time   First MD Initiated Contact with Patient 02/23/22 [phone]     (approximate)   History   Chief Complaint Chest Pain and Shortness of Breath   HPI Matthew Forbes is a 46 y.o. male, hypertension, gout, hydrocephalus, presents emergency department for evaluation of chest pain/shortness of breath x4 days.  Reports fever, night sweats, sinus congestion, and chest tightness that is gradually developed over the past 4 days.  Sick contacts.  He has not taken any medications thus far.  He does state that he has a history of COVID pneumonia within the past year.  Denies abdominal pain, flank pain, nausea/vomiting, diarrhea, dysuria, dizziness/lightheadedness, or rash/lesions.  History Limitations: No limitations.        Physical Exam  Triage Vital Signs: ED Triage Vitals  Enc Vitals Group     BP 02/23/22 0735 (!) 179/111     Pulse Rate 02/23/22 0735 96     Resp 02/23/22 0735 20     Temp 02/23/22 0735 99.2 F (37.3 C)     Temp Source 02/23/22 0735 Oral     SpO2 02/23/22 0735 96 %     Weight 02/23/22 0734 240 lb (108.9 kg)     Height 02/23/22 0734 5\' 6"  (1.676 m)     Head Circumference --      Peak Flow --      Pain Score 02/23/22 0734 6     Pain Loc --      Pain Edu? --      Excl. in GC? --     Most recent vital signs: Vitals:   02/23/22 0735  BP: (!) 179/111  Pulse: 96  Resp: 20  Temp: 99.2 F (37.3 C)  SpO2: 96%    General: Awake, mildly tachypneic, though still speaking in full sentences. Skin: Warm, dry. No rashes or lesions.  Eyes: PERRL. Conjunctivae normal.  CV: Good peripheral perfusion.  Resp: Slightly labored.  Mild wheezing present bilaterally in the bases. Abd: Soft, non-tender. No distention.  Neuro: At baseline. No gross neurological deficits.  Musculoskeletal: Normal ROM of all extremities.  Focused Exam: N/A.  Physical Exam    ED Results / Procedures / Treatments   Labs (all labs ordered are listed, but only abnormal results are displayed) Labs Reviewed  RESP PANEL BY RT-PCR (FLU A&B, COVID) ARPGX2 - Abnormal; Notable for the following components:      Result Value   SARS Coronavirus 2 by RT PCR POSITIVE (*)    All other components within normal limits  BASIC METABOLIC PANEL - Abnormal; Notable for the following components:   Glucose, Bld 135 (*)    Creatinine, Ser 1.26 (*)    Calcium 8.8 (*)    All other components within normal limits  CBC  TROPONIN I (HIGH SENSITIVITY)  TROPONIN I (HIGH SENSITIVITY)     EKG Sinus rhythm, rate of 98, no segment changes, no AV blocks, no axis deviations, no QT prolongation, normal QRS interval.    RADIOLOGY  ED Provider Interpretation: I personally reviewed and interpreted this chest x-ray, no evidence of pneumonia.  DG Chest 2 View  Result Date: 02/23/2022 CLINICAL DATA:  46 year old male with history of chest pain and shortness of breath. EXAM: CHEST - 2 VIEW COMPARISON:  Chest x-ray 06/05/2020. FINDINGS: Small bore tubing projecting over the right hemithorax, similar to the prior examination, presumably ventriculoperitoneal shunt tubing (the lateral tube appears discontinuous, similar to the prior  study). Lung volumes are normal. No consolidative airspace disease. No pleural effusions. No pneumothorax. No pulmonary nodule or mass noted. Pulmonary vasculature and the cardiomediastinal silhouette are within normal limits. IMPRESSION: 1.  No radiographic evidence of acute cardiopulmonary disease. Electronically Signed   By: Trudie Reed M.D.   On: 02/23/2022 07:57    PROCEDURES:  Critical Care performed: N/A.  Procedures    MEDICATIONS ORDERED IN ED: Medications  acetaminophen (TYLENOL) tablet 1,000 mg (1,000 mg Oral Given 02/23/22 0805)  ipratropium-albuterol (DUONEB) 0.5-2.5 (3) MG/3ML nebulizer solution 3 mL (3 mLs Nebulization Given 02/23/22 0806)  predniSONE (DELTASONE) tablet 60 mg (60 mg  Oral Given 02/23/22 0805)  sodium chloride 0.9 % bolus 1,000 mL (1,000 mLs Intravenous New Bag/Given 02/23/22 0833)     IMPRESSION / MDM / ASSESSMENT AND PLAN / ED COURSE  I reviewed the triage vital signs and the nursing notes.                              Differential diagnosis includes, but is not limited to, COVID-19, influenza, bronchitis, community-acquired pneumonia, viral URI, allergic rhinitis.   ED Course Patient appears clinically stable, but uncomfortable and mildly tachypneic.  Wheezing appreciated on exam.  Will treat here with acetaminophen, prednisone, and DuoNeb solution.  No leukocytosis or anemia.  BMP shows no significant electrolyte abnormalities.  However, AKI present with a creatinine of 1.26.  Will initiate IV fluids.  Initial troponin 5, consistent with previous baseline values.  EKG unremarkable.  Unlikely ACS related.  Respiratory panel positive for COVID-19, negative for influenza.  Assessment/Plan Presentation consistent with COVID-19 infection, confirmed by PCR.  Chest x-ray shows no evidence of pneumonia.  Lab work-up has been reassuring.  Overall the patient does appear clinically well, though he does have some mild wheezing bilaterally in the lung bases.  We will provide him with prescriptions for prednisone and albuterol inhaler.  Additionally, given that he has had a history of complications from COVID in the past, will start him on Paxlovid as well.  Recommend they follow-up with a healthcare provider within the next 1 to 2 days for reevaluation.  He was amenable to this plan.  Will discharge.  Considered admission for this patient, but given his stable presentation, he is unlikely to benefit from admission.  Provided the patient with anticipatory guidance, return precautions, and educational material. Encouraged the patient to return to the emergency department at any time if they begin to experience any new or worsening symptoms. Patient expressed  understanding and agreed with the plan.   Patient's presentation is most consistent with acute complicated illness / injury requiring diagnostic workup.       FINAL CLINICAL IMPRESSION(S) / ED DIAGNOSES   Final diagnoses:  COVID-19     Rx / DC Orders   ED Discharge Orders          Ordered    nirmatrelvir/ritonavir EUA (PAXLOVID) 20 x 150 MG & 10 x 100MG  TABS  2 times daily        02/23/22 0917    predniSONE (DELTASONE) 50 MG tablet  Daily with breakfast        02/23/22 0917    albuterol (VENTOLIN HFA) 108 (90 Base) MCG/ACT inhaler  Every 6 hours PRN        02/23/22 0917             Note:  This document was prepared using Dragon voice recognition software  and may include unintentional dictation errors.   Teodoro Spray, [place] 02/23/22 Gardner Candle    Blake Divine, MD 02/23/22 [phone]

## 2022-02-23 NOTE — ED Triage Notes (Signed)
Pt via POV from home. Pt c/o mid-sternal chest tightness, SOB, chills, and cough for the past 4 days. Denies fevers. Denies respiratory/cardiac hx. States he took a COVID test that was negative. Pt is A&Ox4 and NAD

## 2022-02-23 NOTE — ED Notes (Signed)
EDP saw pt, pt complains of head cold, sinus pressure, congestion, and tightness in chest with breathing since 4 days.

## 2022-02-23 NOTE — Discharge Instructions (Addendum)
-  Please take the full course of your Paxlovid and prednisone as prescribed  You may utilize the albuterol inhaler as needed for chest tightness/shortness of breath.  -Please follow-up with a healthcare provider within the next 1 to 2 days for reevaluation.  -Return to the emergency department anytime if you begin to experience any new or worsening symptoms.

## 2022-04-10 IMAGING — DX DG CHEST 1V PORT
1 series · 1 of 1 positions shown · non-contrast
Comparison: January 31, 2018.

CLINICAL DATA: Shortness of breath, PHOVU-24 positive.

EXAM:
PORTABLE CHEST 1 VIEW

[chest ap]
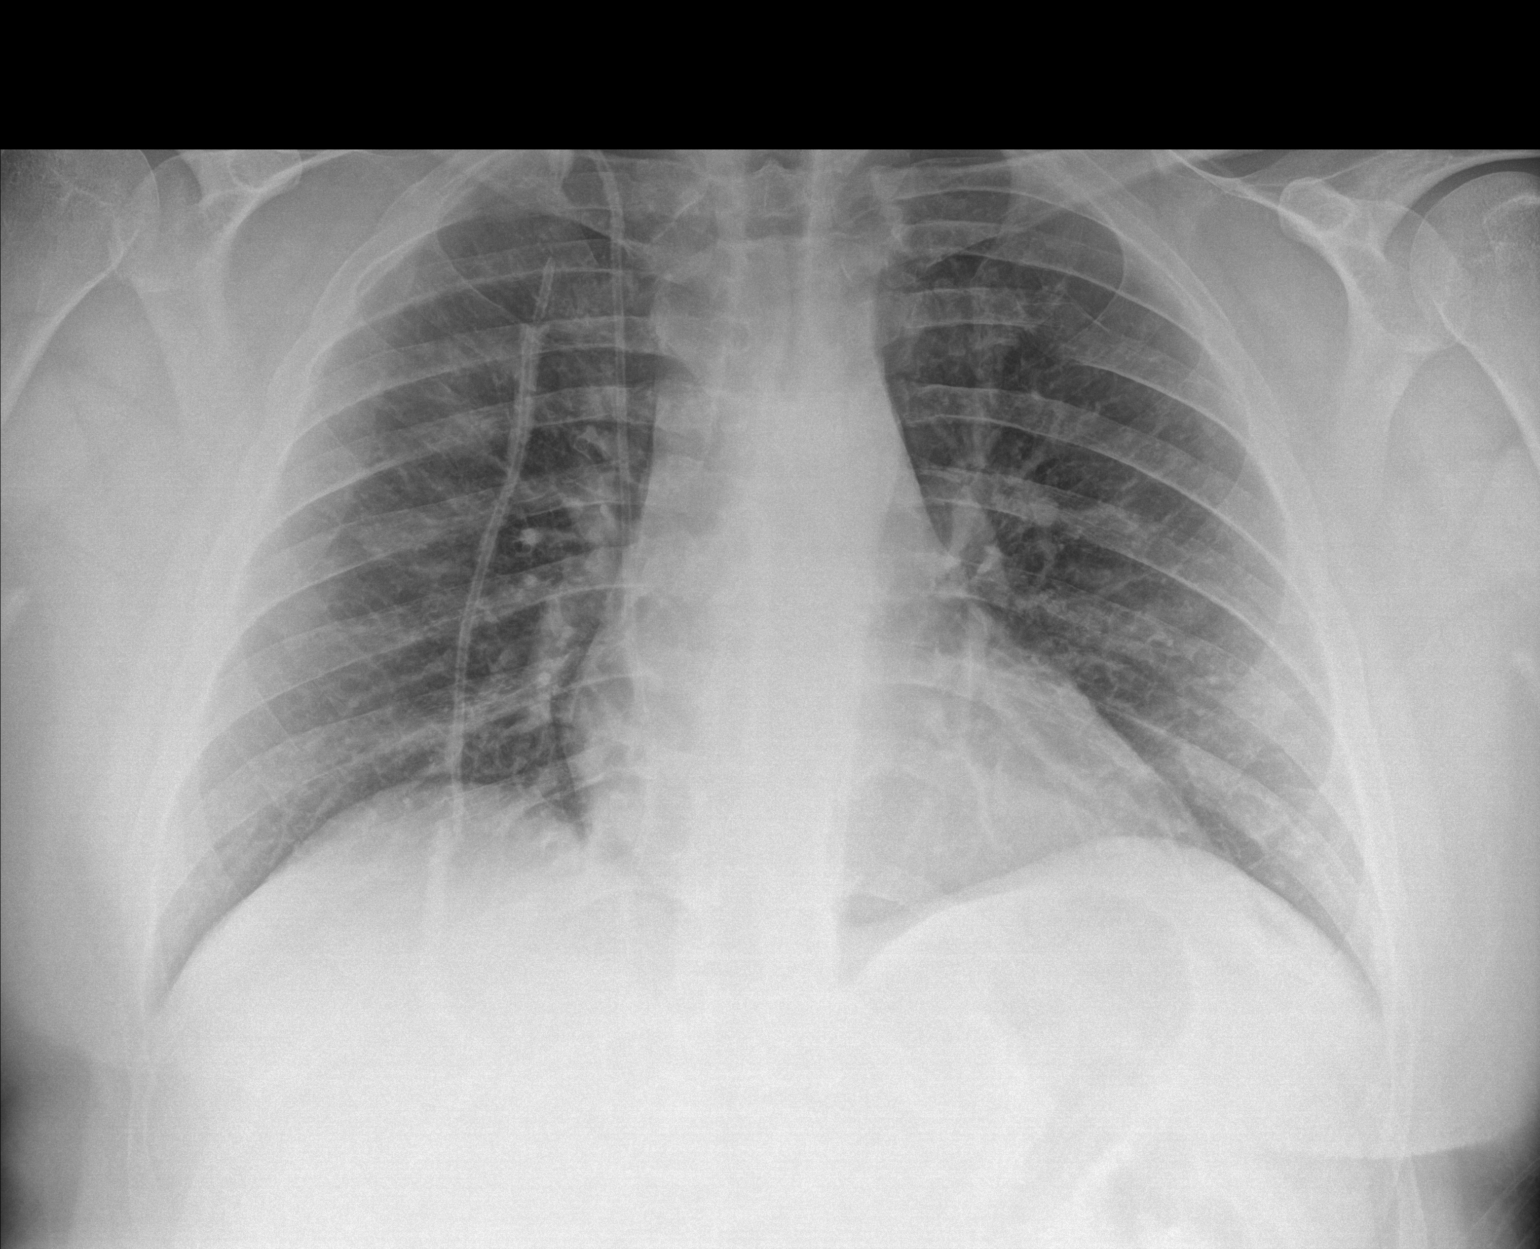

[1 of 1 positions shown; findings below may reference images not displayed]

FINDINGS: The heart size and mediastinal contours are within normal limits.
Both lungs are clear. No pneumothorax or pleural effusion is noted.
The visualized skeletal structures are unremarkable.
IMPRESSION: No active disease.

## 2022-04-10 IMAGING — CT CT ANGIO CHEST
2 of 6 series · 18 of 46 positions shown · IV contrast (APPLIED)
Comparison: Chest x-ray 06/05/2020, CT chest 08/03/2007

CLINICAL DATA: COVID

EXAM:
CT ANGIOGRAPHY CHEST WITH CONTRAST
TECHNIQUE: Multidetector CT imaging of the chest was performed using the
standard protocol during bolus administration of intravenous
contrast. Multiplanar CT image reconstructions and MIPs were
obtained to evaluate the vascular anatomy.
CONTRAST:  75mL OMNIPAQUE IOHEXOL 350 MG/ML SOLN

[Series 7: thins · axial · 0.78mm/px · z∈[-343,-90]mm · 15 of 279 slices shown]
[im 13/279  lung]
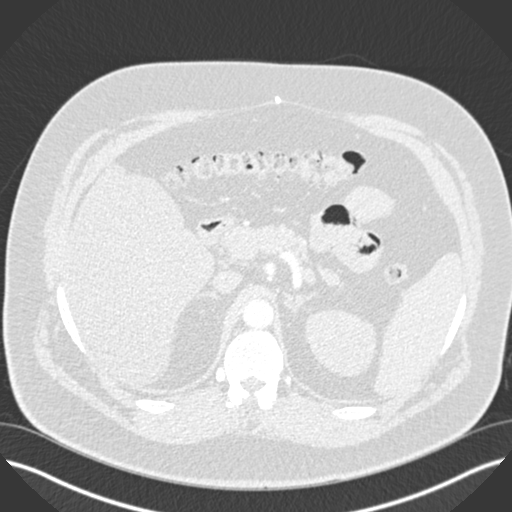
[im 37/279  soft-tissue]
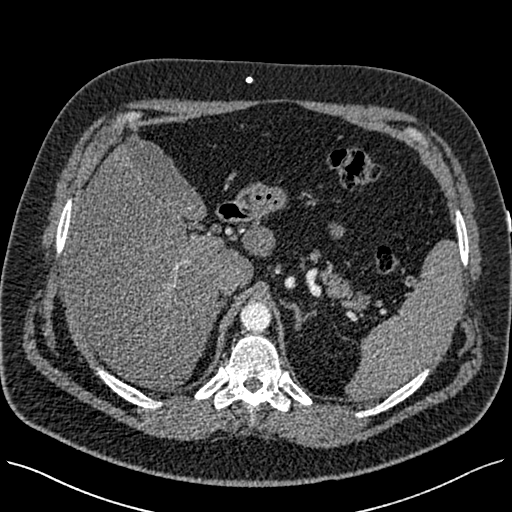
[im 49/279  lung]
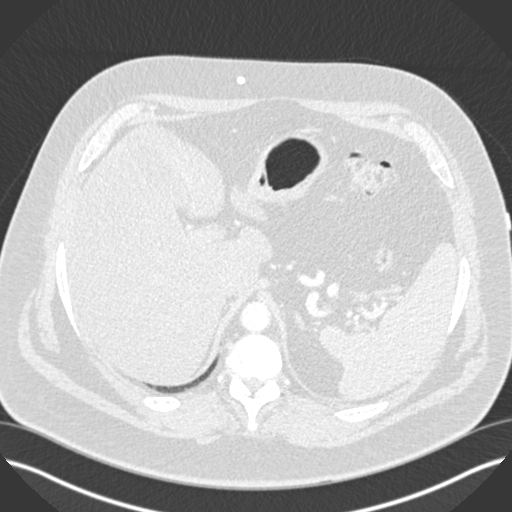
[im 73/279  soft-tissue]
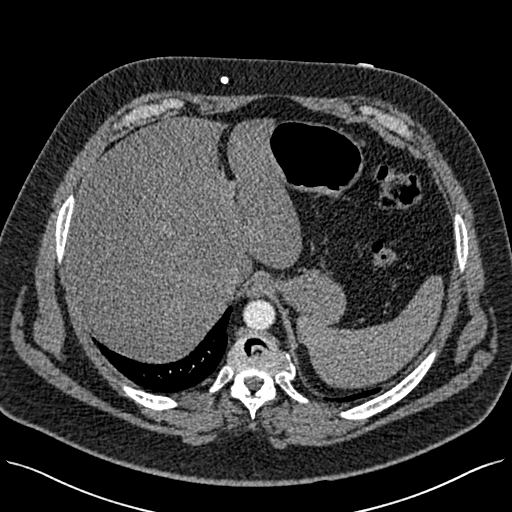
[im 85/279  lung]
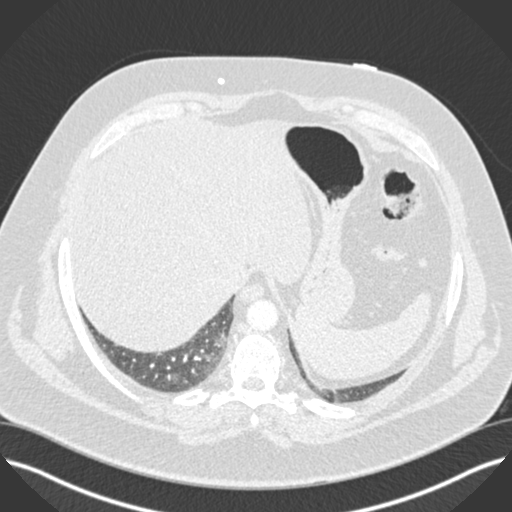
[im 109/279  soft-tissue]
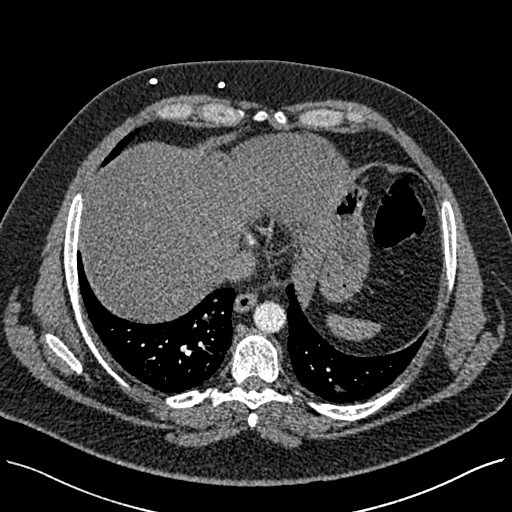
[im 121/279  lung]
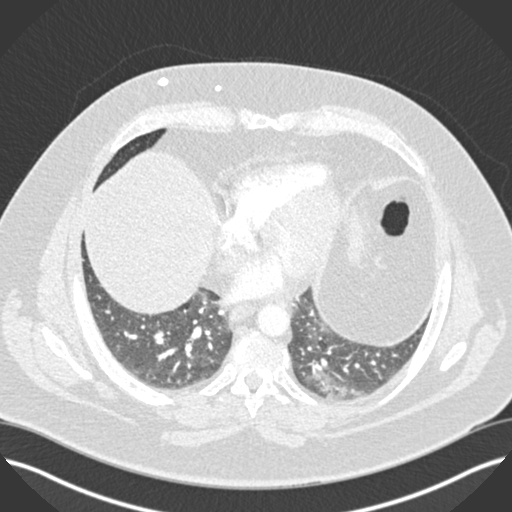
[im 146/279  soft-tissue]
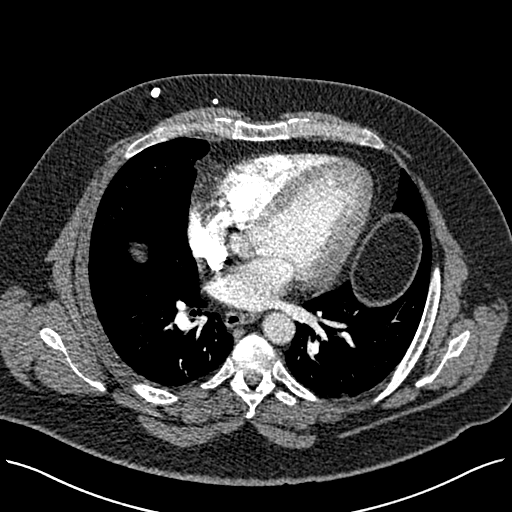
[im 158/279  lung]
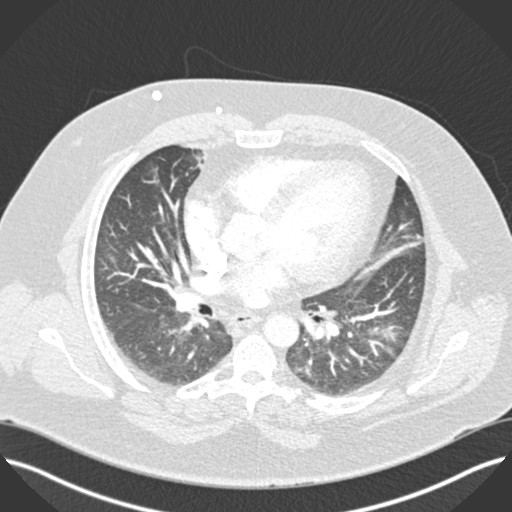
[im 170/279  soft-tissue]
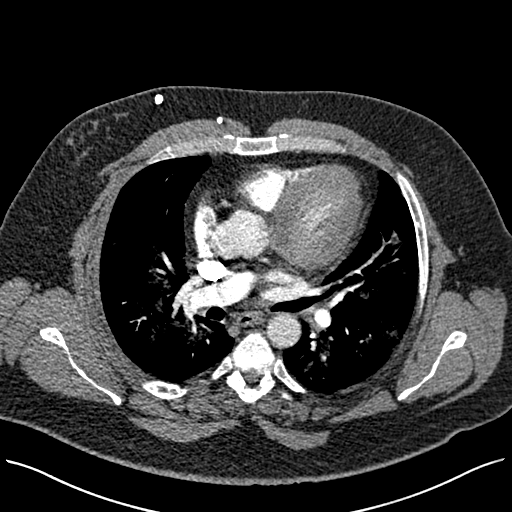
[im 194/279  lung]
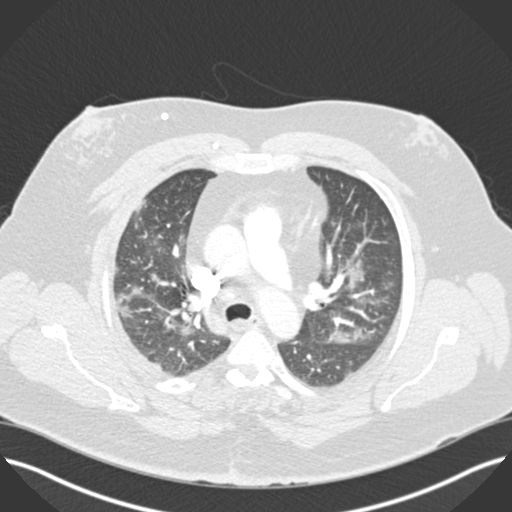
[im 206/279  soft-tissue]
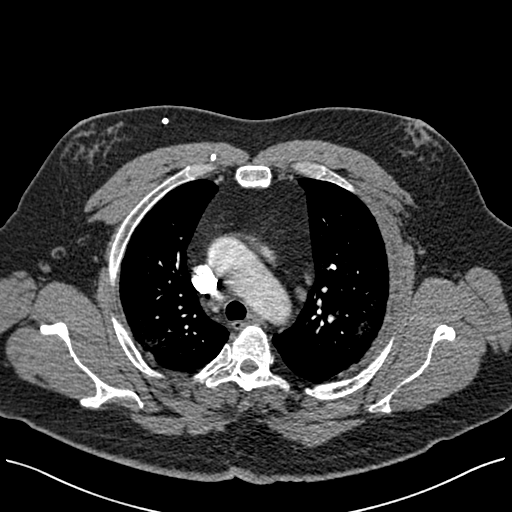
[im 230/279  lung]
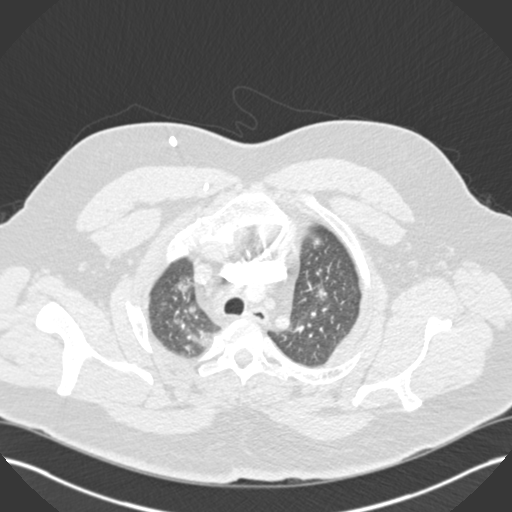
[im 242/279  soft-tissue]
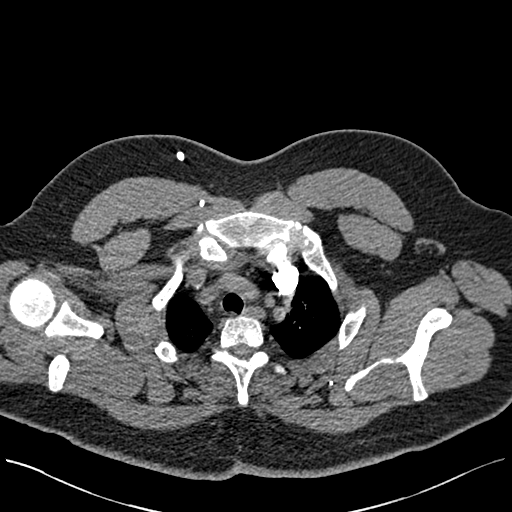
[im 266/279  lung]
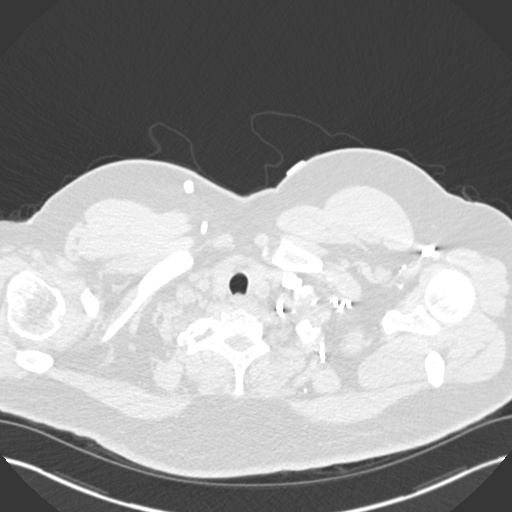

[Series 9: coronal mpr · coronal · 0.60mm/px · 3 of 106 slices shown]
[im 27/106  soft-tissue]
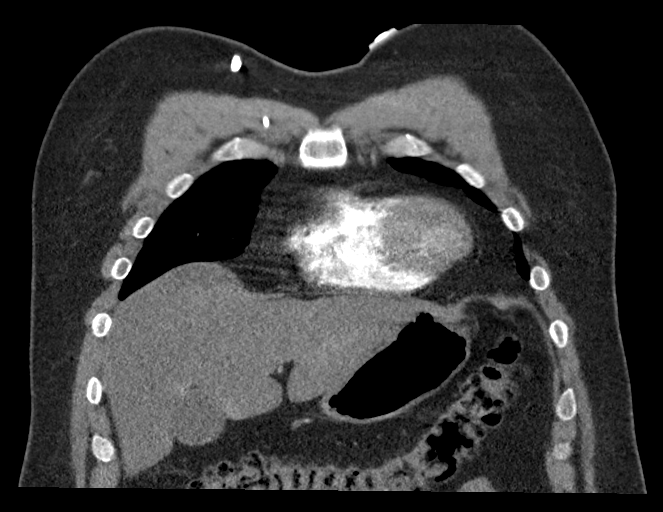
[im 53/106  soft-tissue]
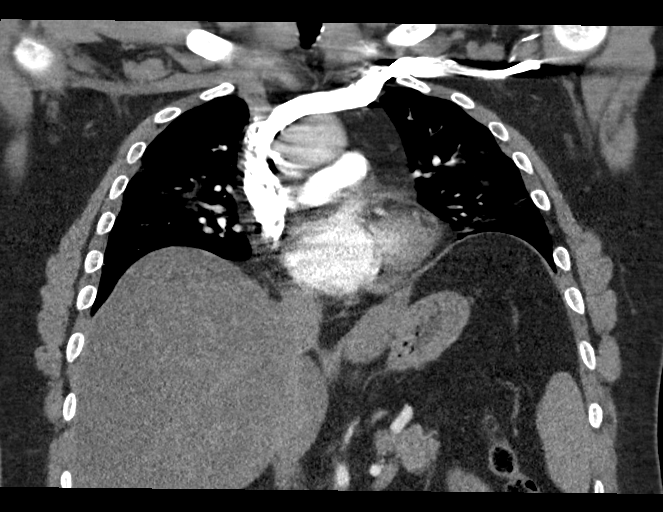
[im 79/106  soft-tissue]
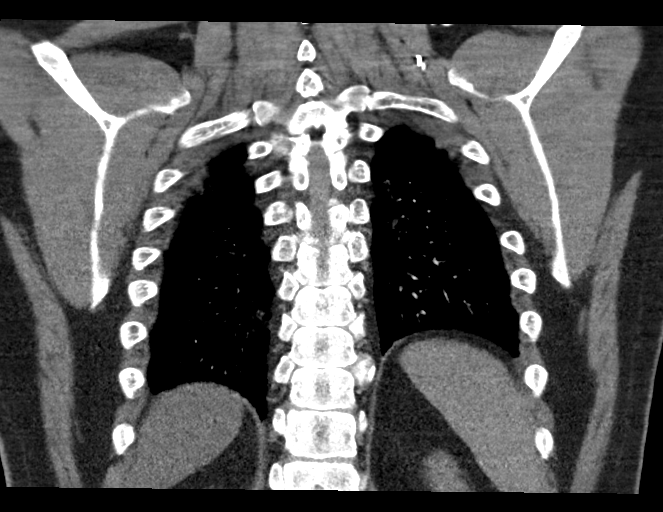

[18 of 46 positions shown; findings below may reference images not displayed]

FINDINGS: Cardiovascular: Satisfactory opacification of the pulmonary arteries
to the segmental level. No evidence of pulmonary embolism.
Borderline cardiomegaly. No pericardial effusion. Nonaneurysmal
aorta.

Mediastinum/Nodes: No enlarged mediastinal, hilar, or axillary lymph
nodes. Thyroid gland, trachea, and esophagus demonstrate no
significant findings.

Lungs/Pleura: Fairly widespread bilateral patchy consolidations and
ground-glass densities consistent with history of COVID pneumonia.
Negative for pleural effusion or pneumothorax

Upper Abdomen: Hepatic steatosis with fat sparing near the
gallbladder fossa. Small gallstones.

Musculoskeletal: No chest wall abnormality. No acute or significant
osseous findings. Bilateral gynecomastia.

Review of the MIP images confirms the above findings.
IMPRESSION: 1. Negative for acute pulmonary embolus.
2. Fairly widespread bilateral patchy consolidations and
ground-glass densities consistent with history of COVID pneumonia.
3. Hepatic steatosis. Cholelithiasis.

## 2023-11-29 ENCOUNTER — Emergency Department
Admission: EM | Admit: 2023-11-29 | Discharge: 2023-11-29 | Disposition: A | Discharge status: Home / Self Care | Payer: Self-pay | Attending: Emergency Medicine | Admitting: Emergency Medicine

## 2023-11-29 ENCOUNTER — Other Ambulatory Visit: Payer: Self-pay

## 2023-11-29 DIAGNOSIS — M10071 Idiopathic gout, right ankle and foot: Secondary | ICD-10-CM | POA: Insufficient documentation

## 2023-11-29 DIAGNOSIS — M1000 Idiopathic gout, unspecified site: Secondary | ICD-10-CM

## 2023-11-29 DIAGNOSIS — I10 Essential (primary) hypertension: Secondary | ICD-10-CM | POA: Insufficient documentation

## 2023-11-29 DIAGNOSIS — L03818 Cellulitis of other sites: Secondary | ICD-10-CM | POA: Insufficient documentation

## 2023-11-29 MED ORDER — PREDNISONE 10 MG PO TABS: 30.0000 mg | ORAL_TABLET | Freq: Every day | ORAL | 0 refills | Status: DC

## 2023-11-29 MED ORDER — LOSARTAN POTASSIUM 50 MG PO TABS: 50.0000 mg | ORAL_TABLET | Freq: Every day | ORAL | 3 refills | Status: AC

## 2023-11-29 MED ORDER — CEPHALEXIN 500 MG PO CAPS: 500.0000 mg | ORAL_CAPSULE | Freq: Three times a day (TID) | ORAL | 0 refills | Status: AC

## 2023-11-29 MED ORDER — COLCHICINE 0.6 MG PO TABS
0.6000 mg | ORAL_TABLET | Freq: Once | ORAL | Status: AC
Start: 2023-11-29 — End: 2023-11-29
  Administered 2023-11-29: 0.6 mg via ORAL
  Filled 2023-11-29: qty 1

## 2023-11-29 MED ORDER — COLCHICINE 0.6 MG PO TABS: 0.6000 mg | ORAL_TABLET | Freq: Two times a day (BID) | ORAL | 2 refills | Status: DC

## 2023-11-29 MED ORDER — OXYCODONE-ACETAMINOPHEN 5-325 MG PO TABS: 1.0000 | ORAL_TABLET | ORAL | 0 refills | Status: DC | PRN

## 2023-11-29 NOTE — ED Provider Notes (Signed)
 [facility] Provider Note    Event Date/Time   First MD Initiated Contact with Patient 11/29/23 1251     (approximate)   History   Foot Pain   HPI  Matthew Forbes is a 48 y.o. male history of hypertension, gout and hydrocephalus presents to the emergency department with concerns of gout flare.  Patient states has history of gout and this feels the same.  Has been going on for 2 to 3 weeks.  Denies fever, chills.  No chest pain shortness of breath.  When asked about his high blood pressure patient states he has had it for a long time has not had any medication as his doctor's office closed and he lost his job.  Patient is willing to start back on high blood pressure medication.  He is concerned about the cost of colchicine  for gout.      Physical Exam   Triage Vital Signs: ED Triage Vitals  Encounter Vitals Group     BP 11/29/23 1158 (!) 191/152     Girls Systolic BP Percentile --      Girls Diastolic BP Percentile --      Boys Systolic BP Percentile --      Boys Diastolic BP Percentile --      Pulse Rate 11/29/23 1158 (!) 120     Resp 11/29/23 1158 18     Temp 11/29/23 1158 (!) 97.5 F (36.4 C)     Temp Source 11/29/23 1158 Oral     SpO2 11/29/23 1158 98 %     Weight 11/29/23 1157 260 lb (117.9 kg)     Height 11/29/23 1157 5' 9 (1.753 m)     Head Circumference --      Peak Flow --      Pain Score 11/29/23 1157 9     Pain Loc --      Pain Education --      Exclude from Growth Chart --     Most recent vital signs: Vitals:   11/29/23 1158 11/29/23 1334  BP: (!) 191/152 (!) 189/134  Pulse: (!) 120 100  Resp: 18 18  Temp: (!) 97.5 F (36.4 C)   SpO2: 98% 98%     General: Awake, no distress.   CV:  Good peripheral perfusion.  Resp:  Normal effort.  Abd:  No distention.   Other:     ED Results / Procedures / Treatments   Labs (all labs ordered are listed, but only abnormal results are displayed) Labs Reviewed - No data to  display   EKG     RADIOLOGY     PROCEDURES:   Procedures  Critical Care:  no Chief Complaint  Patient presents with   Foot Pain      MEDICATIONS ORDERED IN ED: Medications  colchicine  tablet 0.6 mg (0.6 mg Oral Given 11/29/23 1332)     IMPRESSION / MDM / ASSESSMENT AND PLAN / ED COURSE  I reviewed the triage vital signs and the nursing notes.                              Differential diagnosis includes, but is not limited to, gout flare, cellulitis, uncontrolled hypertension, chronic hypertension  Patient's presentation is most consistent with acute illness / injury with system symptoms.    Medications given: Colchicine  1 p.o.  Patient is very concerned about cost as he does not have insurance and does not  want to pay for lab work and x-ray if not absolutely necessary.  Due to him having gout in the past and this feeling the same I do feel we could go ahead and try medications instead of doing labs along with imaging.  This was decided in shared decision making.  Also due to the uncontrolled hypertension I did go ahead and prescribe losartan  50 mg p.o. for him.  He was given a 90-day dose.  Referral to primary care.  He is in agreement with this treatment plan.  Bluford is seen, prednisone  30 mg daily for 3 days, and pain medication sent to the pharmacy for his gout.  Discharged stable condition.  Strict instructions to return if worsening      FINAL CLINICAL IMPRESSION(S) / ED DIAGNOSES   Final diagnoses:  Idiopathic gout, unspecified chronicity, unspecified site  Cellulitis of other specified site  Uncontrolled hypertension     Rx / DC Orders   ED Discharge Orders          Ordered    Ambulatory Referral to Primary Care (Establish Care)       Comments: Uncontrolled hypertension   11/29/23 1308    colchicine  0.6 MG tablet  2 times daily        11/29/23 1315    cephALEXin  (KEFLEX ) 500 MG capsule  3 times daily        11/29/23 1315    predniSONE   (DELTASONE ) 10 MG tablet  Daily with breakfast        11/29/23 1315    oxyCODONE -acetaminophen  (PERCOCET) 5-325 MG tablet  Every 4 hours PRN        11/29/23 1315    losartan  (COZAAR ) 50 MG tablet  Daily        11/29/23 1315             Note:  This document was prepared using Dragon voice recognition software and may include unintentional dictation errors.    Gasper Devere ORN, PA-C 11/29/23 1356    Claudene Rover, MD 11/29/23 [phone]

## 2023-11-29 NOTE — ED Notes (Signed)
 Pt verbalizes understanding of discharge instructions. Opportunity for questioning and answers were provided. Pt discharged from ED to home.   ? ?

## 2023-11-29 NOTE — ED Triage Notes (Signed)
 Patient states he has been having a gout flare to right foot for 2 weeks; states he doesn't take medication regularly.

## 2024-03-01 ENCOUNTER — Emergency Department
Admission: EM | Admit: 2024-03-01 | Discharge: 2024-03-01 | Disposition: A | Discharge status: Home / Self Care | Payer: Self-pay | Attending: Emergency Medicine | Admitting: Emergency Medicine

## 2024-03-01 ENCOUNTER — Other Ambulatory Visit: Payer: Self-pay

## 2024-03-01 DIAGNOSIS — F1123 Opioid dependence with withdrawal: Secondary | ICD-10-CM | POA: Insufficient documentation

## 2024-03-01 DIAGNOSIS — F1193 Opioid use, unspecified with withdrawal: Secondary | ICD-10-CM

## 2024-03-01 DIAGNOSIS — I10 Essential (primary) hypertension: Secondary | ICD-10-CM

## 2024-03-01 LAB — CBC
HCT: 45.9 % (ref 39.0–52.0)
Hemoglobin: 15.6 g/dL (ref 13.0–17.0)
MCH: 28.7 pg (ref 26.0–34.0)
MCHC: 34 g/dL (ref 30.0–36.0)
MCV: 84.4 fL (ref 80.0–100.0)
Platelets: 237 K/uL (ref 150–400)
RBC: 5.44 MIL/uL (ref 4.22–5.81)
RDW: 12.3 % (ref 11.5–15.5)
WBC: 10.6 K/uL — ABNORMAL HIGH (ref 4.0–10.5)
nRBC: 0 % (ref 0.0–0.2)

## 2024-03-01 LAB — COMPREHENSIVE METABOLIC PANEL WITH GFR
ALT: 18 U/L (ref 0–44)
AST: 18 U/L (ref 15–41)
Albumin: 4.5 g/dL (ref 3.5–5.0)
Alkaline Phosphatase: 46 U/L (ref 38–126)
Anion gap: 13 (ref 5–15)
BUN: 15 mg/dL (ref 6–20)
CO2: 26 mmol/L (ref 22–32)
Calcium: 9.5 mg/dL (ref 8.9–10.3)
Chloride: 103 mmol/L (ref 98–111)
Creatinine, Ser: 1.27 mg/dL — ABNORMAL HIGH (ref 0.61–1.24)
GFR, Estimated: >60 mL/min (ref >60)
Glucose, Bld: 111 mg/dL — ABNORMAL HIGH (ref 70–99)
Potassium: 3.8 mmol/L (ref 3.5–5.1)
Sodium: 142 mmol/L (ref 135–145)
Total Bilirubin: 0.4 mg/dL (ref 0.0–1.2)
Total Protein: 7.9 g/dL (ref 6.5–8.1)

## 2024-03-01 LAB — ETHANOL: Alcohol, Ethyl (B): <15 mg/dL (ref <15)

## 2024-03-01 MED ORDER — KETOROLAC TROMETHAMINE 30 MG/ML IJ SOLN
30.0000 mg | Freq: Once | INTRAMUSCULAR | Status: AC
  Administered 2024-03-01: 30 mg via INTRAVENOUS
  Filled 2024-03-01: qty 1

## 2024-03-01 MED ORDER — SODIUM CHLORIDE 0.9 % IV SOLN: Freq: Once | INTRAVENOUS | Status: AC

## 2024-03-01 MED ORDER — ONDANSETRON 4 MG PO TBDP: 4.0000 mg | ORAL_TABLET | Freq: Three times a day (TID) | ORAL | 0 refills | Status: AC | PRN

## 2024-03-01 MED ORDER — ONDANSETRON HCL 4 MG/2ML IJ SOLN
4.0000 mg | Freq: Once | INTRAMUSCULAR | Status: AC
  Administered 2024-03-01: 4 mg via INTRAVENOUS
  Filled 2024-03-01: qty 2

## 2024-03-01 MED ORDER — AMLODIPINE BESYLATE 5 MG PO TABS: 5.0000 mg | ORAL_TABLET | Freq: Every day | ORAL | 2 refills | Status: DC

## 2024-03-01 MED ORDER — IBUPROFEN 200 MG PO TABS
600.0000 mg | ORAL_TABLET | Freq: Three times a day (TID) | ORAL | 2 refills | Status: AC | PRN
Start: 2024-03-01 — End: 2025-03-01

## 2024-03-01 MED ORDER — CLONIDINE HCL 0.1 MG PO TABS
0.1000 mg | ORAL_TABLET | Freq: Once | ORAL | Status: AC
  Administered 2024-03-01: 0.1 mg via ORAL
  Filled 2024-03-01: qty 1

## 2024-03-01 NOTE — Discharge Instructions (Addendum)
 You were treated for opiate withdrawal and your symptoms improved.  Please do follow-up with [facility] as planned for treatment of your opiate dependence.  As discussed, I do believe you have baseline hypertension, I have started you on a new medication to help with this.  I also placed a referral for you to follow-up with a primary care provider.  I prescribed a nausea medication to use as needed, and you can use Motrin as needed for any body aches.  Return to the emergency department with any new or worsening symptoms.

## 2024-03-01 NOTE — ED Triage Notes (Signed)
 Pt reports opiod withdrawals, last dose 24 hours ago. States decreased usage a few days ago, last took dilaudid 24 hours ago. Had also been using oxycodone .

## 2024-03-01 NOTE — ED Provider Notes (Signed)
 [facility] Provider Note    Event Date/Time   First MD Initiated Contact with Patient 03/01/24 1349     (approximate)   History   Withdrawal   HPI  Matthew Forbes is a 48 y.o. male who presents with complaints of opioid withdrawal, patient reports he is attempting to stop using opioids, is having significant withdrawals including headache, nausea, high heart rate.  He is here for symptom relief and a prescription for something to help him through withdrawals     Physical Exam   Triage Vital Signs: ED Triage Vitals  Encounter Vitals Group     BP 03/01/24 1330 (!) 193/161     Girls Systolic BP Percentile --      Girls Diastolic BP Percentile --      Boys Systolic BP Percentile --      Boys Diastolic BP Percentile --      Pulse Rate 03/01/24 1328 (!) 114     Resp 03/01/24 1328 20     Temp 03/01/24 1327 97.9 F (36.6 C)     Temp src --      SpO2 03/01/24 1328 99 %     Weight 03/01/24 1330 102.1 kg (225 lb)     Height 03/01/24 1330 1.753 m (5' 9)     Head Circumference --      Peak Flow --      Pain Score 03/01/24 1328 8     Pain Loc --      Pain Education --      Exclude from Growth Chart --     Most recent vital signs: Vitals:   03/01/24 1328 03/01/24 1330  BP:  (!) 193/161  Pulse: (!) 114   Resp: 20   Temp:    SpO2: 99%      General: Awake, no distress.  CV:  Good peripheral perfusion.  Mild tachycardia Resp:  Normal effort.  Abd:  No distention.  Soft, nontender Other:     ED Results / Procedures / Treatments   Labs (all labs ordered are listed, but only abnormal results are displayed) Labs Reviewed  COMPREHENSIVE METABOLIC PANEL WITH GFR - Abnormal; Notable for the following components:      Result Value   Glucose, Bld 111 (*)    Creatinine, Ser 1.27 (*)    All other components within normal limits  CBC - Abnormal; Notable for the following components:   WBC 10.6 (*)    All other components within normal limits   ETHANOL  URINE DRUG SCREEN, QUALITATIVE ([facility] ONLY)     EKG     RADIOLOGY     PROCEDURES:  Critical Care performed:   Procedures   MEDICATIONS ORDERED IN ED: Medications  0.9 %  sodium chloride  infusion (has no administration in time range)  ondansetron (ZOFRAN) injection 4 mg (has no administration in time range)  ketorolac  (TORADOL ) 30 MG/ML injection 30 mg (has no administration in time range)  cloNIDine (CATAPRES) tablet 0.1 mg (has no administration in time range)     IMPRESSION / MDM / ASSESSMENT AND PLAN / ED COURSE  I reviewed the triage vital signs and the nursing notes. Patient's presentation is most consistent with severe exacerbation of chronic illness.   Patient with opioid dependence here for withdrawal symptoms, he is mildly tachycardic and hypertensive this is likely related to withdrawal, will treat with IV Zofran, IV Toradol , IV fluids, p.o. clonidine will likely be able to be discharged with supportive measures  Will asked my colleague to reevaluate after treatment      FINAL CLINICAL IMPRESSION(S) / ED DIAGNOSES   Final diagnoses:  Opioid withdrawal (HCC)     Rx / DC Orders   ED Discharge Orders     None        Note:  This document was prepared using Dragon voice recognition software and may include unintentional dictation errors.   Arlander Charleston, MD 03/01/24 [phone]

## 2024-03-03 ENCOUNTER — Emergency Department: Payer: Self-pay

## 2024-03-03 ENCOUNTER — Emergency Department
Admission: EM | Admit: 2024-03-03 | Discharge: 2024-03-03 | Disposition: A | Discharge status: Home / Self Care | Payer: Self-pay | Attending: Emergency Medicine | Admitting: Emergency Medicine

## 2024-03-03 ENCOUNTER — Encounter: Payer: Self-pay | Admitting: Intensive Care

## 2024-03-03 ENCOUNTER — Other Ambulatory Visit: Payer: Self-pay

## 2024-03-03 DIAGNOSIS — F1193 Opioid use, unspecified with withdrawal: Secondary | ICD-10-CM

## 2024-03-03 DIAGNOSIS — F1123 Opioid dependence with withdrawal: Secondary | ICD-10-CM | POA: Insufficient documentation

## 2024-03-03 DIAGNOSIS — I10 Essential (primary) hypertension: Secondary | ICD-10-CM | POA: Insufficient documentation

## 2024-03-03 DIAGNOSIS — T40725A Adverse effect of synthetic cannabinoids, initial encounter: Secondary | ICD-10-CM | POA: Insufficient documentation

## 2024-03-03 LAB — BASIC METABOLIC PANEL WITH GFR
Anion gap: 11 (ref 5–15)
BUN: 17 mg/dL (ref 6–20)
CO2: 23 mmol/L (ref 22–32)
Calcium: 9.2 mg/dL (ref 8.9–10.3)
Chloride: 104 mmol/L (ref 98–111)
Creatinine, Ser: 1.29 mg/dL — ABNORMAL HIGH (ref 0.61–1.24)
GFR, Estimated: >60 mL/min (ref >60)
Glucose, Bld: 120 mg/dL — ABNORMAL HIGH (ref 70–99)
Potassium: 3.4 mmol/L — ABNORMAL LOW (ref 3.5–5.1)
Sodium: 138 mmol/L (ref 135–145)

## 2024-03-03 LAB — CBC
HCT: 43.8 % (ref 39.0–52.0)
Hemoglobin: 15.4 g/dL (ref 13.0–17.0)
MCH: 29.7 pg (ref 26.0–34.0)
MCHC: 35.2 g/dL (ref 30.0–36.0)
MCV: 84.4 fL (ref 80.0–100.0)
Platelets: 219 K/uL (ref 150–400)
RBC: 5.19 MIL/uL (ref 4.22–5.81)
RDW: 12.4 % (ref 11.5–15.5)
WBC: 9.1 K/uL (ref 4.0–10.5)
nRBC: 0 % (ref 0.0–0.2)

## 2024-03-03 LAB — TROPONIN I (HIGH SENSITIVITY): Troponin I (High Sensitivity): 6 ng/L (ref <18)

## 2024-03-03 MED ORDER — BUPRENORPHINE HCL-NALOXONE HCL 8-2 MG SL SUBL: SUBLINGUAL_TABLET | SUBLINGUAL | 0 refills | Status: AC

## 2024-03-03 MED ORDER — BUPRENORPHINE HCL-NALOXONE HCL 8-2 MG SL SUBL
1.0000 | SUBLINGUAL_TABLET | SUBLINGUAL | Status: AC
  Administered 2024-03-03: 1 via SUBLINGUAL
  Filled 2024-03-03: qty 1

## 2024-03-03 MED ORDER — AMLODIPINE BESYLATE 10 MG PO TABS: 10.0000 mg | ORAL_TABLET | Freq: Every day | ORAL | 2 refills | Status: AC

## 2024-03-03 MED ORDER — LABETALOL HCL 200 MG PO TABS
200.0000 mg | ORAL_TABLET | Freq: Once | ORAL | Status: AC
  Administered 2024-03-03: 200 mg via ORAL
  Filled 2024-03-03: qty 1

## 2024-03-03 NOTE — ED Provider Notes (Signed)
 [facility] Provider Note    Event Date/Time   First MD Initiated Contact with Patient 03/03/24 1541     (approximate)   History   Chief Complaint: Ingestion   HPI  Matthew Forbes is a 48 y.o. male with a history of gout, hypertension who comes ED complaining of feeling jittery and heart racing after taking half of a cannabinoid gummy containing 420 mg.  This is the first time he had tried cannabinoid Gummies.  He was taking it to try to ameliorate his opioid withdrawal symptoms.  He acknowledges that he has opioid dependence from acquiring friends prescribed opioids.  He has not had any opioids in the last 3 days, and is having bodyaches chills sweats and restlessness.  He came to the ED few days ago, was prescribed clonidine and Zofran which temporarily helped but he was afraid of clonidine causing hypotension so he has not continued that medication.  Also notes that his blood pressure is usually about 180/100 over the long-term.  He does not currently take any blood pressure medicine.  He reports he is already contacted [facility], and done an intake appointment.  He is scheduled to start group therapy and medication management in 1 to 2 weeks.        Past Medical History:  Diagnosis Date   Gout    Hydrocephalus [facility])    As a child   Hypertension     Current Outpatient Rx   Order #: [id number] Class: Normal   Order #: [id number] Class: Normal   Order #: [id number] Class: Normal   Order #: [id number] Class: Normal   Order #: [id number] Class: Normal   Order #: [id number] Class: Print   Order #: [id number] Class: Normal   Order #: [id number] Class: Normal   Order #: [id number] Class: Normal   Order #: [id number] Class: Normal   Order #: [id number] Class: Normal   Order #: [id number] Class: Print    History reviewed. No pertinent surgical history.  Physical Exam   Triage Vital Signs: ED Triage Vitals [03/03/24 1523]  Encounter Vitals Group     BP (!) 227/133      Girls Systolic BP Percentile      Girls Diastolic BP Percentile      Boys Systolic BP Percentile      Boys Diastolic BP Percentile      Pulse Rate (!) 122     Resp 20     Temp 98.2 F (36.8 C)     Temp Source Oral     SpO2 100 %     Weight 225 lb (102.1 kg)     Height 5' 9 (1.753 m)     Head Circumference      Peak Flow      Pain Score 0     Pain Loc      Pain Education      Exclude from Growth Chart     Most recent vital signs: Vitals:   03/03/24 1523 03/03/24 1630  BP: (!) 227/133 (!) 167/112  Pulse: (!) 122 88  Resp: 20 13  Temp: 98.2 F (36.8 C)   SpO2: 100% 98%    General: Awake, no distress.  CV:  Good peripheral perfusion.  Tachycardia heart rate 110 Resp:  Normal effort.  Clear lungs Abd:  No distention.  Soft nontender Other:  Somewhat restless.  No exophthalmos.  Thyroid nonpalpable.  Nontoxic.   ED Results / Procedures / Treatments   Labs (all labs ordered are listed, but only abnormal results are displayed) Labs  Reviewed  BASIC METABOLIC PANEL WITH GFR - Abnormal; Notable for the following components:      Result Value   Potassium 3.4 (*)    Glucose, Bld 120 (*)    Creatinine, Ser 1.29 (*)    All other components within normal limits  CBC  TROPONIN I (HIGH SENSITIVITY)     EKG Interpreted by me Sinus tachycardia rate 123.  Normal axis, normal intervals.  No evidence of cardiac toxicity or ischemia.   RADIOLOGY Chest x-ray interpreted by me, unremarkable.  Radiology report reviewed   PROCEDURES:  Procedures   MEDICATIONS ORDERED IN ED: Medications  buprenorphine-naloxone (SUBOXONE) 8-2 mg per SL tablet 1 tablet (has no administration in time range)  buprenorphine-naloxone (SUBOXONE) 8-2 mg per SL tablet 1 tablet (1 tablet Sublingual Given 03/03/24 1620)  labetalol (NORMODYNE) tablet 200 mg (200 mg Oral Given 03/03/24 1637)     IMPRESSION / MDM / ASSESSMENT AND PLAN / ED COURSE  I reviewed the triage vital signs and the nursing  notes.  DDx: Cannabinoid toxicity, opioid withdrawal, electrolyte derangement, anemia, unlikely non-STEMI  Patient's presentation is most consistent with acute presentation with potential threat to life or bodily function.  Patient presents with symptoms of accidental cannabinoid overdose as well as opioid withdrawal.  He is motivated to start opioid dependence treatment, but the interval until he can start medication management has had worried that he will need to revert back to taking opioids to manage his symptoms.  Will start Suboxone in the ED, give labetalol to help manage his hypertension tachycardia.  He is certain that he has not been exposed to cocaine or methamphetamine.   Clinical Course as of 03/03/24 1737  Sun Mar 03, 2024  1726 Feeling better, heart rate normalized.  Blood pressure still moderately elevated.  Will start amlodipine 10 for uncontrolled hypertension.  Give second dose of buprenorphine, start Suboxone on outpatient until his scheduled appointment with [facility] [PS]    Clinical Course User Index [PS] Viviann Pastor, MD     FINAL CLINICAL IMPRESSION(S) / ED DIAGNOSES   Final diagnoses:  Opioid withdrawal (HCC)  Adverse effect of synthetic cannabinoid, initial encounter  Uncontrolled hypertension     Rx / DC Orders   ED Discharge Orders          Ordered    buprenorphine-naloxone (SUBOXONE) 8-2 mg SUBL SL tablet  Multiple Frequencies        03/03/24 1725    amLODipine (NORVASC) 10 MG tablet  Daily        03/03/24 1725    Ambulatory Referral to Primary Care (Establish Care)        03/03/24 1726             Note:  This document was prepared using Dragon voice recognition software and may include unintentional dictation errors.   Viviann Pastor, MD 03/03/24 [phone]

## 2024-03-03 NOTE — ED Triage Notes (Addendum)
 Patient reports taking an edible and reports now his heart is racing. Reports he feels jittery.  Non compliant with blood pressure medication and has not been taking it  Patient uses opioids daily and reports he has not had any in a few days

## 2024-03-04 ENCOUNTER — Encounter: Payer: Self-pay | Admitting: *Deleted

## 2024-03-04 ENCOUNTER — Emergency Department
Admission: EM | Admit: 2024-03-04 | Discharge: 2024-03-05 | Disposition: A | Discharge status: Home / Self Care | Payer: Self-pay | Attending: Emergency Medicine | Admitting: Emergency Medicine

## 2024-03-04 ENCOUNTER — Other Ambulatory Visit: Payer: Self-pay

## 2024-03-04 DIAGNOSIS — R112 Nausea with vomiting, unspecified: Secondary | ICD-10-CM | POA: Insufficient documentation

## 2024-03-04 DIAGNOSIS — F1190 Opioid use, unspecified, uncomplicated: Secondary | ICD-10-CM | POA: Insufficient documentation

## 2024-03-04 DIAGNOSIS — R109 Unspecified abdominal pain: Secondary | ICD-10-CM | POA: Insufficient documentation

## 2024-03-04 DIAGNOSIS — R519 Headache, unspecified: Secondary | ICD-10-CM | POA: Insufficient documentation

## 2024-03-04 DIAGNOSIS — I10 Essential (primary) hypertension: Secondary | ICD-10-CM | POA: Insufficient documentation

## 2024-03-04 LAB — BASIC METABOLIC PANEL WITH GFR
Anion gap: 13 (ref 5–15)
BUN: 18 mg/dL (ref 6–20)
CO2: 23 mmol/L (ref 22–32)
Calcium: 9.6 mg/dL (ref 8.9–10.3)
Chloride: 102 mmol/L (ref 98–111)
Creatinine, Ser: 1.15 mg/dL (ref 0.61–1.24)
GFR, Estimated: >60 mL/min (ref >60)
Glucose, Bld: 168 mg/dL — ABNORMAL HIGH (ref 70–99)
Potassium: 4.1 mmol/L (ref 3.5–5.1)
Sodium: 138 mmol/L (ref 135–145)

## 2024-03-04 LAB — HEPATIC FUNCTION PANEL
ALT: 21 U/L (ref 0–44)
AST: 22 U/L (ref 15–41)
Albumin: 4.3 g/dL (ref 3.5–5.0)
Alkaline Phosphatase: 42 U/L (ref 38–126)
Bilirubin, Direct: 0.2 mg/dL (ref 0.0–0.2)
Indirect Bilirubin: 0.5 mg/dL (ref 0.3–0.9)
Total Bilirubin: 0.7 mg/dL (ref 0.0–1.2)
Total Protein: 7.6 g/dL (ref 6.5–8.1)

## 2024-03-04 LAB — CBC
HCT: 43.6 % (ref 39.0–52.0)
Hemoglobin: 15.2 g/dL (ref 13.0–17.0)
MCH: 29.1 pg (ref 26.0–34.0)
MCHC: 34.9 g/dL (ref 30.0–36.0)
MCV: 83.5 fL (ref 80.0–100.0)
Platelets: 207 K/uL (ref 150–400)
RBC: 5.22 MIL/uL (ref 4.22–5.81)
RDW: 12.8 % (ref 11.5–15.5)
WBC: 10.6 K/uL — ABNORMAL HIGH (ref 4.0–10.5)
nRBC: 0 % (ref 0.0–0.2)

## 2024-03-04 MED ORDER — ONDANSETRON 4 MG PO TBDP
4.0000 mg | ORAL_TABLET | Freq: Once | ORAL | Status: AC
  Administered 2024-03-04: 4 mg via ORAL
  Filled 2024-03-04: qty 1

## 2024-03-04 NOTE — ED Provider Notes (Signed)
 [facility] Provider Note    Event Date/Time   First MD Initiated Contact with Patient 03/04/24 2315     (approximate)   History   Allergic Reaction   HPI  Matthew Forbes is a 48 y.o. male   Past medical history of hydrocephalus with VP shunt, gout, hypertension, drug use, feeling unwell after taking his first dose of outpatient Suboxone earlier today.  Symptoms started after taking that medication.  He did get a dose in the emergency department yesterday when he was started on the medication and was reported to have had suspected opioid withdrawal with symptoms improved after the administration.  At that time he was treated for suspected accidental cannabinoid overdose as well.  Since taking his Suboxone earlier this morning, he has felt upset stomach, nausea, vomiting and a headache.  He is feeling much better now without any intervention.   External Medical Documents Reviewed: Hospital notes from yesterday      Physical Exam   Triage Vital Signs: ED Triage Vitals  Encounter Vitals Group     BP 03/04/24 2026 (!) 193/128     Girls Systolic BP Percentile --      Girls Diastolic BP Percentile --      Boys Systolic BP Percentile --      Boys Diastolic BP Percentile --      Pulse Rate 03/04/24 2026 92     Resp 03/04/24 2026 20     Temp 03/04/24 2026 98.7 F (37.1 C)     Temp Source 03/04/24 2026 Oral     SpO2 03/04/24 2026 96 %     Weight 03/04/24 2024 225 lb (102.1 kg)     Height 03/04/24 2024 5' 9 (1.753 m)     Head Circumference --      Peak Flow --      Pain Score 03/04/24 2024 0     Pain Loc --      Pain Education --      Exclude from Growth Chart --     Most recent vital signs: Vitals:   03/04/24 2026 03/05/24 0143  BP: (!) 193/128 (!) 191/109  Pulse: 92 82  Resp: 20 18  Temp: 98.7 F (37.1 C) 98.6 F (37 C)  SpO2: 96% 96%    General: Awake, no distress.  CV:  Good peripheral perfusion.  Resp:  Normal effort.   Abd:  No distention.  Other:  Hypertensive otherwise vital signs unremarkable and this pleasant well-appearing gentleman.  Neck supple full range of motion no signs of head trauma.  Clear lungs, soft nontender abdomen, no tremor.  Moving all extremities.  Gait normal.   ED Results / Procedures / Treatments   Labs (all labs ordered are listed, but only abnormal results are displayed) Labs Reviewed  BASIC METABOLIC PANEL WITH GFR - Abnormal; Notable for the following components:      Result Value   Glucose, Bld 168 (*)    All other components within normal limits  CBC - Abnormal; Notable for the following components:   WBC 10.6 (*)    All other components within normal limits  HEPATIC FUNCTION PANEL     I ordered and reviewed the above labs they are notable for cell counts and electrolytes unremarkable.     RADIOLOGY I independently reviewed and interpreted CT head and see no obvious bleeding or midline shift I also reviewed radiologist's formal read.   PROCEDURES:  Critical Care performed: No  Procedures  MEDICATIONS ORDERED IN ED: Medications  ondansetron (ZOFRAN-ODT) disintegrating tablet 4 mg (4 mg Oral Given 03/04/24 2356)    External physician / consultants:  I spoke with Dr. Penne Sharps of neurosurgery regarding care plan for this patient.   IMPRESSION / MDM / ASSESSMENT AND PLAN / ED COURSE  I reviewed the triage vital signs and the nursing notes.                                Patient's presentation is most consistent with acute presentation with potential threat to life or bodily function.  Differential diagnosis includes, but is not limited to, adverse effect of medication, withdrawal, viral illness, VP shunt problem   The patient is on the cardiac monitor to evaluate for evidence of arrhythmia and/or significant heart rate changes.  MDM:    Mostly of symptoms have resolved but he believes it may be an adverse effect of taking his Suboxone on  an empty stomach.  This may be a possibility but however with his VP shunt in place, I thought it prudent to check with a shunt series.  Unfortunately this shunt series did show some abnormality in the neck area and so I followed up with a CT of the head which fortunately looks normal with no hydrocephalus noted.  I reviewed the patient's clinical presentation as well as the imaging findings with Dr. Penne Sharps of neurosurgery who does not believe that this is a VP shunt complication.  Patient is completely asymptomatic on recheck.  Plan will be for discharge.       FINAL CLINICAL IMPRESSION(S) / ED DIAGNOSES   Final diagnoses:  Nonintractable headache, unspecified chronicity pattern, unspecified headache type  Nausea and vomiting, unspecified vomiting type     Rx / DC Orders   ED Discharge Orders          Ordered    ondansetron (ZOFRAN-ODT) 4 MG disintegrating tablet  Every 8 hours PRN        03/05/24 0223             Note:  This document was prepared using Dragon voice recognition software and may include unintentional dictation errors.    Cyrena Mylar, MD 03/05/24 [phone]

## 2024-03-04 NOTE — ED Triage Notes (Signed)
 Pt ambulatory to triage.  Pt reports taking suboxone at 11am today for the first time.  Pt was seen in er yesterday and started meds today.  Pt reports nausea, emesis and headache since taking the med.  Pt alert  speech clear.    No rash.  No resp distress.

## 2024-03-05 ENCOUNTER — Emergency Department: Payer: Self-pay

## 2024-03-05 MED ORDER — ONDANSETRON 4 MG PO TBDP: 4.0000 mg | ORAL_TABLET | Freq: Three times a day (TID) | ORAL | 0 refills | Status: AC | PRN

## 2024-03-05 NOTE — Discharge Instructions (Signed)
 Take acetaminophen  650 mg and ibuprofen 400 mg every 6 hours for pain.  Take with food. Use zofran for nausea.   Thank you for choosing us  for your health care today!  Please see your primary doctor this week for a follow up appointment.   If you have any new, worsening, or unexpected symptoms call your doctor right away or come back to the emergency department for reevaluation.  It was my pleasure to care for you today.   Ginnie EDISON Cyrena, MD

## 2024-04-13 ENCOUNTER — Emergency Department
Admission: EM | Admit: 2024-04-13 | Discharge: 2024-04-13 | Disposition: A | Discharge status: Home / Self Care | Payer: Self-pay | Attending: Emergency Medicine | Admitting: Emergency Medicine

## 2024-04-13 ENCOUNTER — Emergency Department: Payer: Self-pay

## 2024-04-13 ENCOUNTER — Other Ambulatory Visit: Payer: Self-pay

## 2024-04-13 ENCOUNTER — Encounter: Payer: Self-pay | Admitting: Emergency Medicine

## 2024-04-13 DIAGNOSIS — I10 Essential (primary) hypertension: Secondary | ICD-10-CM | POA: Insufficient documentation

## 2024-04-13 DIAGNOSIS — M25561 Pain in right knee: Secondary | ICD-10-CM | POA: Insufficient documentation

## 2024-04-13 DIAGNOSIS — G8929 Other chronic pain: Secondary | ICD-10-CM | POA: Insufficient documentation

## 2024-04-13 HISTORY — DX: Opioid dependence, uncomplicated: F11.20

## 2024-04-13 MED ORDER — PREDNISONE 10 MG PO TABS
30.0000 mg | ORAL_TABLET | Freq: Every day | ORAL | 0 refills | Status: AC
Start: 2024-04-13 — End: ?

## 2024-04-13 MED ORDER — PREDNISONE 20 MG PO TABS
60.0000 mg | ORAL_TABLET | ORAL | Status: AC
  Administered 2024-04-13: 60 mg via ORAL
  Filled 2024-04-13: qty 3

## 2024-04-13 MED ORDER — COLCHICINE 0.6 MG PO TABS: 0.6000 mg | ORAL_TABLET | Freq: Two times a day (BID) | ORAL | 1 refills | Status: AC

## 2024-04-13 MED ORDER — KETOROLAC TROMETHAMINE 30 MG/ML IJ SOLN
30.0000 mg | Freq: Once | INTRAMUSCULAR | Status: AC
  Administered 2024-04-13: 30 mg via INTRAMUSCULAR
  Filled 2024-04-13: qty 1

## 2024-04-13 NOTE — ED Triage Notes (Addendum)
 Pt with right knee pain. Hx of gout typically in ankles but never had it in the knee before. Able to ambulate with pain. Denies numbness but endorsing a little bit of tingling, like when it falls asleep. Denies known injury or falls. No obvious swelling or deformity

## 2024-04-13 NOTE — ED Provider Notes (Signed)
 [facility] Provider Note    Event Date/Time   First MD Initiated Contact with Patient 04/13/24 [phone]     (approximate)   History   Knee Pain   HPI Matthew Forbes is a 48 y.o. male with a past medical history that includes gout, hydrocephalus, hypertension, and opioid dependence, who presents for evaluation of about a month or more of pain in his right knee.  He said it normally hurts on the left (inner) side of the right knee, but now it also hurts a little bit above it.  It is much worse if he crouches down or bends down.  Hurts a little bit when he walks on it.  Sometimes it seems to be more swollen than others.  He has not had any particular accident or injury of which he is aware.  There is no redness or warmth.  He has a history of gout that usually affects his ankles but he does not know if this might be gout as well.  He came in because it was worse tonight than it usually is.     Physical Exam   Triage Vital Signs: ED Triage Vitals  Encounter Vitals Group     BP 04/13/24 0437 (!) 171/101     Girls Systolic BP Percentile --      Girls Diastolic BP Percentile --      Boys Systolic BP Percentile --      Boys Diastolic BP Percentile --      Pulse Rate 04/13/24 0437 91     Resp 04/13/24 0437 18     Temp 04/13/24 0437 99 F (37.2 C)     Temp Source 04/13/24 0437 Oral     SpO2 04/13/24 0437 98 %     Weight 04/13/24 0438 102.1 kg (225 lb)     Height 04/13/24 0438 1.753 m (5' 9)     Head Circumference --      Peak Flow --      Pain Score 04/13/24 0438 6     Pain Loc --      Pain Education --      Exclude from Growth Chart --     Most recent vital signs: Vitals:   04/13/24 0530 04/13/24 0600  BP: (!) 142/98 (!) 140/88  Pulse: 67 (!) 55  Resp:    Temp:    SpO2:      General: Awake, no obvious distress. CV:  Good peripheral perfusion.  Resp:  Normal effort. Speaking easily and comfortably, no accessory muscle usage nor intercostal  retractions.   Abd:  No distention.  Other:  There may be a small suprapatellar effusion of the right knee but it is difficult to appreciate.  He reports some generalized tenderness but it does not seem to be point tender.  He is able to flex and extend his knee and bear weight when ambulating.  There is no erythema or particularly increased warmth.  Both legs are well-perfused and he has no peripheral edema.   ED Results / Procedures / Treatments   Labs (all labs ordered are listed, but only abnormal results are displayed) Labs Reviewed - No data to display    RADIOLOGY See ED course for details   PROCEDURES:  Critical Care performed: No  Procedures    IMPRESSION / MDM / ASSESSMENT AND PLAN / ED COURSE  I reviewed the triage vital signs and the nursing notes.  Differential diagnosis includes, but is not limited to, arthritis, nonspecific musculoskeletal pain, gout, much less likely septic arthritis.  Patient's presentation is most consistent with acute complicated illness / injury requiring diagnostic workup.  Labs/studies ordered: Right knee x-rays  Interventions/Medications given:  Medications  ketorolac  (TORADOL ) 30 MG/ML injection 30 mg (30 mg Intramuscular Given 04/13/24 0501)  predniSONE  (DELTASONE ) tablet 60 mg (60 mg Oral Given 04/13/24 0501)    (Note:  hospital course my include additional interventions and/or labs/studies not listed above.)   Strongly suspect chronic arthritic changes, though gout is also possible but less likely based on physical exam and history of present symptoms.  Patient is hypertensive, but looking back through his prior visits (this is the fifth visit in 6 months), he is always hypertensive.  He has had 2 visits during this time.  For opioid withdrawal and apparently he was taking opioids that he got from a friend.  He required medical treatment, once with clonidine  and another time he received some  Suboxone .  I advised the patient that we would get some x-rays and then I will refer him to the appropriate outpatient party to follow-up but there is unlikely to be a specific way to fix the pain or solve the problem tonight.  He seems displeased with this but agrees with the plan for evaluation.  I am working medication as listed above and I have refilled his colchicine  and prednisone  prescription     Clinical Course as of 04/13/24 0615  Sat Apr 13, 2024  0548 DG Knee Complete 4 Views Right I independently viewed and interpreted the patient's knee x-rays and I see no evidence of fracture or dislocation.  The radiologist confirmed no bony abnormalities though there is a small suprapatellar effusion which correlates clinically. [CF]  0612 DG Knee Complete 4 Views Right Reassessed patient.  No clinical change.  I updated him about the nonemergent findings, my conservative recommendations, and recommendation for outpatient follow-up.  He said that he understands.  The patient's medical screening exam is reassuring with no indication of an emergent medical condition requiring hospitalization or additional evaluation at this point.  The patient is safe and appropriate for discharge and outpatient follow up. [CF]    Clinical Course User Index [CF] Gordan Huxley, MD     FINAL CLINICAL IMPRESSION(S) / ED DIAGNOSES   Final diagnoses:  Chronic pain of right knee     Rx / DC Orders   ED Discharge Orders          Ordered    predniSONE  (DELTASONE ) 10 MG tablet  Daily with breakfast        04/13/24 0524    colchicine  0.6 MG tablet  2 times daily        04/13/24 0524             Note:  This document was prepared using Dragon voice recognition software and may include unintentional dictation errors.   Gordan Huxley, MD 04/13/24 [phone]
